# Patient Record
Sex: Female | Born: 1990 | Race: Black or African American | Hispanic: No | Marital: Single | Smoking: Never smoker
Health system: Southern US, Community
[De-identification: ages and names within clinical notes are randomized; demographics above are authoritative.]

## PROBLEM LIST (undated history)

## (undated) DIAGNOSIS — F988 Other specified behavioral and emotional disorders with onset usually occurring in childhood and adolescence: Secondary | ICD-10-CM

## (undated) DIAGNOSIS — I319 Disease of pericardium, unspecified: Secondary | ICD-10-CM

## (undated) DIAGNOSIS — R569 Unspecified convulsions: Secondary | ICD-10-CM

## (undated) DIAGNOSIS — K589 Irritable bowel syndrome without diarrhea: Secondary | ICD-10-CM

## (undated) DIAGNOSIS — G43909 Migraine, unspecified, not intractable, without status migrainosus: Secondary | ICD-10-CM

## (undated) HISTORY — DX: Unspecified convulsions: R56.9

## (undated) HISTORY — DX: Migraine, unspecified, not intractable, without status migrainosus: G43.909

## (undated) HISTORY — DX: Other specified behavioral and emotional disorders with onset usually occurring in childhood and adolescence: F98.8

## (undated) HISTORY — DX: Irritable bowel syndrome without diarrhea: K58.9

---
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
## 2010-05-22 ENCOUNTER — Other Ambulatory Visit (HOSPITAL_COMMUNITY): Payer: Self-pay | Admitting: Orthopedic Surgery

## 2010-05-22 DIAGNOSIS — M25511 Pain in right shoulder: Secondary | ICD-10-CM

## 2010-06-02 ENCOUNTER — Encounter (HOSPITAL_COMMUNITY)
Admission: RE | Admit: 2010-06-02 | Discharge: 2010-06-02 | Disposition: A | Discharge status: Home / Self Care | Payer: BC Managed Care – PPO | Source: Ambulatory Visit | Attending: Orthopedic Surgery | Admitting: Orthopedic Surgery

## 2010-06-02 DIAGNOSIS — M25511 Pain in right shoulder: Secondary | ICD-10-CM

## 2010-06-02 DIAGNOSIS — M25519 Pain in unspecified shoulder: Secondary | ICD-10-CM | POA: Insufficient documentation

## 2010-06-02 MED ORDER — TECHNETIUM TC 99M MEDRONATE IV KIT
25.0000 | PACK | Freq: Once | INTRAVENOUS | Status: AC | PRN
  Administered 2010-06-02: 25 via INTRAVENOUS

## 2010-06-10 HISTORY — PX: SHOULDER SURGERY: SHX246

## 2010-07-01 ENCOUNTER — Other Ambulatory Visit: Payer: Self-pay | Admitting: Orthopedic Surgery

## 2010-07-01 ENCOUNTER — Ambulatory Visit
Admission: RE | Admit: 2010-07-01 | Discharge: 2010-07-01 | Disposition: A | Discharge status: Home / Self Care | Payer: BC Managed Care – PPO | Source: Ambulatory Visit | Attending: Orthopedic Surgery | Admitting: Orthopedic Surgery

## 2010-07-01 DIAGNOSIS — M24019 Loose body in unspecified shoulder: Secondary | ICD-10-CM

## 2010-08-14 ENCOUNTER — Other Ambulatory Visit: Payer: Self-pay | Admitting: Orthopedic Surgery

## 2010-08-14 DIAGNOSIS — M25511 Pain in right shoulder: Secondary | ICD-10-CM

## 2010-08-22 ENCOUNTER — Other Ambulatory Visit: Payer: PRIVATE HEALTH INSURANCE

## 2010-08-22 ENCOUNTER — Inpatient Hospital Stay: Admission: RE | Admit: 2010-08-22 | Payer: PRIVATE HEALTH INSURANCE | Source: Ambulatory Visit

## 2010-08-24 ENCOUNTER — Emergency Department (HOSPITAL_COMMUNITY)
Admission: EM | Admit: 2010-08-24 | Discharge: 2010-08-24 | Disposition: A | Discharge status: Home / Self Care | Payer: BC Managed Care – PPO | Attending: Emergency Medicine | Admitting: Emergency Medicine

## 2010-08-24 ENCOUNTER — Emergency Department (HOSPITAL_COMMUNITY): Payer: BC Managed Care – PPO

## 2010-08-24 ENCOUNTER — Emergency Department (HOSPITAL_COMMUNITY)
Admission: EM | Admit: 2010-08-24 | Discharge: 2010-08-24 | Discharge status: Against Medical Advice | Payer: PRIVATE HEALTH INSURANCE | Attending: Emergency Medicine | Admitting: Emergency Medicine

## 2010-08-24 DIAGNOSIS — R296 Repeated falls: Secondary | ICD-10-CM | POA: Insufficient documentation

## 2010-08-24 DIAGNOSIS — Y92009 Unspecified place in unspecified non-institutional (private) residence as the place of occurrence of the external cause: Secondary | ICD-10-CM | POA: Insufficient documentation

## 2010-08-24 DIAGNOSIS — IMO0002 Reserved for concepts with insufficient information to code with codable children: Secondary | ICD-10-CM | POA: Insufficient documentation

## 2010-08-24 DIAGNOSIS — M79609 Pain in unspecified limb: Secondary | ICD-10-CM | POA: Insufficient documentation

## 2010-08-30 ENCOUNTER — Emergency Department (HOSPITAL_COMMUNITY): Payer: BC Managed Care – PPO

## 2010-08-30 ENCOUNTER — Emergency Department (HOSPITAL_COMMUNITY)
Admission: EM | Admit: 2010-08-30 | Discharge: 2010-08-30 | Disposition: A | Discharge status: Home / Self Care | Payer: BC Managed Care – PPO | Attending: Emergency Medicine | Admitting: Emergency Medicine

## 2010-08-30 DIAGNOSIS — X500XXA Overexertion from strenuous movement or load, initial encounter: Secondary | ICD-10-CM | POA: Insufficient documentation

## 2010-08-30 DIAGNOSIS — M7989 Other specified soft tissue disorders: Secondary | ICD-10-CM | POA: Insufficient documentation

## 2010-08-30 DIAGNOSIS — F411 Generalized anxiety disorder: Secondary | ICD-10-CM | POA: Insufficient documentation

## 2010-08-30 DIAGNOSIS — M25579 Pain in unspecified ankle and joints of unspecified foot: Secondary | ICD-10-CM | POA: Insufficient documentation

## 2010-08-30 DIAGNOSIS — S93409A Sprain of unspecified ligament of unspecified ankle, initial encounter: Secondary | ICD-10-CM | POA: Insufficient documentation

## 2010-11-09 ENCOUNTER — Inpatient Hospital Stay (INDEPENDENT_AMBULATORY_CARE_PROVIDER_SITE_OTHER)
Admission: RE | Admit: 2010-11-09 | Discharge: 2010-11-09 | Disposition: A | Discharge status: Home / Self Care | Payer: BC Managed Care – PPO | Source: Ambulatory Visit | Attending: Emergency Medicine | Admitting: Emergency Medicine

## 2010-11-09 DIAGNOSIS — R112 Nausea with vomiting, unspecified: Secondary | ICD-10-CM

## 2010-11-09 LAB — CBC
HCT: 39.2 % (ref 36.0–46.0)
Hemoglobin: 13.3 g/dL (ref 12.0–15.0)
MCH: 31.1 pg (ref 26.0–34.0)
MCV: 91.8 fL (ref 78.0–100.0)
RBC: 4.27 MIL/uL (ref 3.87–5.11)

## 2010-11-09 LAB — POCT URINALYSIS DIP (DEVICE)
Bilirubin Urine: NEGATIVE
Hgb urine dipstick: NEGATIVE
Ketones, ur: 15 mg/dL — AB
Specific Gravity, Urine: 1.02 (ref 1.005–1.030)
pH: 7 (ref 5.0–8.0)

## 2010-11-09 LAB — DIFFERENTIAL
Lymphocytes Relative: 29 % (ref 12–46)
Lymphs Abs: 2.1 10*3/uL (ref 0.7–4.0)
Monocytes Relative: 8 % (ref 3–12)
Neutro Abs: 4.4 10*3/uL (ref 1.7–7.7)
Neutrophils Relative %: 61 % (ref 43–77)

## 2010-11-09 LAB — COMPREHENSIVE METABOLIC PANEL
ALT: 13 U/L (ref 0–35)
CO2: 26 mEq/L (ref 19–32)
Calcium: 9.8 mg/dL (ref 8.4–10.5)
Creatinine, Ser: 0.56 mg/dL (ref 0.50–1.10)
GFR calc Af Amer: >60 mL/min (ref >60)
GFR calc non Af Amer: >60 mL/min (ref >60)
Glucose, Bld: 89 mg/dL (ref 70–99)
Sodium: 139 mEq/L (ref 135–145)

## 2010-11-09 LAB — POCT I-STAT, CHEM 8
BUN: 11 mg/dL (ref 6–23)
Chloride: 105 mEq/L (ref 96–112)
Sodium: 140 mEq/L (ref 135–145)

## 2010-11-10 HISTORY — PX: CHOLECYSTECTOMY: SHX55

## 2010-11-12 ENCOUNTER — Emergency Department (HOSPITAL_COMMUNITY): Payer: BC Managed Care – PPO

## 2010-11-12 ENCOUNTER — Emergency Department (HOSPITAL_COMMUNITY)
Admission: EM | Admit: 2010-11-12 | Discharge: 2010-11-13 | Disposition: A | Discharge status: Home / Self Care | Payer: BC Managed Care – PPO | Attending: Emergency Medicine | Admitting: Emergency Medicine

## 2010-11-12 DIAGNOSIS — R197 Diarrhea, unspecified: Secondary | ICD-10-CM | POA: Insufficient documentation

## 2010-11-12 DIAGNOSIS — R1013 Epigastric pain: Secondary | ICD-10-CM | POA: Insufficient documentation

## 2010-11-12 DIAGNOSIS — R748 Abnormal levels of other serum enzymes: Secondary | ICD-10-CM | POA: Insufficient documentation

## 2010-11-12 DIAGNOSIS — R112 Nausea with vomiting, unspecified: Secondary | ICD-10-CM | POA: Insufficient documentation

## 2010-11-12 LAB — COMPREHENSIVE METABOLIC PANEL
CO2: 28 mEq/L (ref 19–32)
Calcium: 9.9 mg/dL (ref 8.4–10.5)
Creatinine, Ser: 0.65 mg/dL (ref 0.50–1.10)
GFR calc Af Amer: >90 mL/min (ref >90)
GFR calc non Af Amer: >90 mL/min (ref >90)
Glucose, Bld: 90 mg/dL (ref 70–99)
Total Bilirubin: 0.9 mg/dL (ref 0.3–1.2)

## 2010-11-12 LAB — URINALYSIS, ROUTINE W REFLEX MICROSCOPIC
Glucose, UA: NEGATIVE mg/dL
Hgb urine dipstick: NEGATIVE
Ketones, ur: NEGATIVE mg/dL
Leukocytes, UA: NEGATIVE
Protein, ur: NEGATIVE mg/dL

## 2010-11-12 LAB — DIFFERENTIAL
Lymphocytes Relative: 28 % (ref 12–46)
Lymphs Abs: 2.4 10*3/uL (ref 0.7–4.0)
Monocytes Relative: 10 % (ref 3–12)
Neutro Abs: 5.4 10*3/uL (ref 1.7–7.7)
Neutrophils Relative %: 61 % (ref 43–77)

## 2010-11-12 LAB — CBC
HCT: 40.6 % (ref 36.0–46.0)
Hemoglobin: 13.9 g/dL (ref 12.0–15.0)
MCH: 31.3 pg (ref 26.0–34.0)
MCV: 91.4 fL (ref 78.0–100.0)
RBC: 4.44 MIL/uL (ref 3.87–5.11)

## 2010-11-14 ENCOUNTER — Inpatient Hospital Stay (HOSPITAL_COMMUNITY)
Admission: AD | Admit: 2010-11-14 | Discharge: 2010-11-22 | DRG: 171 | Disposition: A | Discharge status: Home / Self Care | Payer: BC Managed Care – PPO | Source: Ambulatory Visit | Attending: Gastroenterology | Admitting: Gastroenterology

## 2010-11-14 ENCOUNTER — Inpatient Hospital Stay (HOSPITAL_COMMUNITY): Payer: BC Managed Care – PPO

## 2010-11-14 DIAGNOSIS — E860 Dehydration: Secondary | ICD-10-CM | POA: Diagnosis present

## 2010-11-14 DIAGNOSIS — R1013 Epigastric pain: Principal | ICD-10-CM | POA: Diagnosis present

## 2010-11-14 DIAGNOSIS — K811 Chronic cholecystitis: Secondary | ICD-10-CM | POA: Diagnosis present

## 2010-11-14 DIAGNOSIS — Z79899 Other long term (current) drug therapy: Secondary | ICD-10-CM

## 2010-11-14 DIAGNOSIS — R7989 Other specified abnormal findings of blood chemistry: Secondary | ICD-10-CM | POA: Diagnosis present

## 2010-11-14 DIAGNOSIS — M25519 Pain in unspecified shoulder: Secondary | ICD-10-CM | POA: Diagnosis present

## 2010-11-14 LAB — DIFFERENTIAL
Basophils Absolute: 0 10*3/uL (ref 0.0–0.1)
Basophils Relative: 1 % (ref 0–1)
Eosinophils Absolute: 0.2 10*3/uL (ref 0.0–0.7)
Monocytes Absolute: 0.5 10*3/uL (ref 0.1–1.0)
Neutro Abs: 3.6 10*3/uL (ref 1.7–7.7)
Neutrophils Relative %: 54 % (ref 43–77)

## 2010-11-14 LAB — COMPREHENSIVE METABOLIC PANEL
ALT: 72 U/L — ABNORMAL HIGH (ref 0–35)
AST: 204 U/L — ABNORMAL HIGH (ref 0–37)
Alkaline Phosphatase: 75 U/L (ref 39–117)
Calcium: 10.1 mg/dL (ref 8.4–10.5)
GFR calc Af Amer: >90 mL/min (ref >90)
Glucose, Bld: 81 mg/dL (ref 70–99)
Potassium: 4 mEq/L (ref 3.5–5.1)
Sodium: 139 mEq/L (ref 135–145)
Total Protein: 7 g/dL (ref 6.0–8.3)

## 2010-11-14 LAB — CBC
Hemoglobin: 14.2 g/dL (ref 12.0–15.0)
MCHC: 34.5 g/dL (ref 30.0–36.0)
Platelets: 209 10*3/uL (ref 150–400)
RBC: 4.52 MIL/uL (ref 3.87–5.11)

## 2010-11-14 LAB — PROTIME-INR
INR: 1 (ref 0.00–1.49)
Prothrombin Time: 13.4 seconds (ref 11.6–15.2)

## 2010-11-15 ENCOUNTER — Inpatient Hospital Stay (HOSPITAL_COMMUNITY): Payer: BC Managed Care – PPO

## 2010-11-15 DIAGNOSIS — K8020 Calculus of gallbladder without cholecystitis without obstruction: Secondary | ICD-10-CM

## 2010-11-15 DIAGNOSIS — R112 Nausea with vomiting, unspecified: Secondary | ICD-10-CM

## 2010-11-15 DIAGNOSIS — R1013 Epigastric pain: Secondary | ICD-10-CM

## 2010-11-15 LAB — COMPREHENSIVE METABOLIC PANEL
ALT: 50 U/L — ABNORMAL HIGH (ref 0–35)
AST: 94 U/L — ABNORMAL HIGH (ref 0–37)
Albumin: 3.3 g/dL — ABNORMAL LOW (ref 3.5–5.2)
Alkaline Phosphatase: 63 U/L (ref 39–117)
CO2: 27 mEq/L (ref 19–32)
Chloride: 106 mEq/L (ref 96–112)
GFR calc non Af Amer: >90 mL/min (ref >90)
Potassium: 4.2 mEq/L (ref 3.5–5.1)
Sodium: 140 mEq/L (ref 135–145)
Total Bilirubin: 0.8 mg/dL (ref 0.3–1.2)

## 2010-11-15 LAB — SURGICAL PCR SCREEN: MRSA, PCR: NEGATIVE

## 2010-11-15 MED ORDER — GADOBENATE DIMEGLUMINE 529 MG/ML IV SOLN
11.0000 mL | Freq: Once | INTRAVENOUS | Status: AC
  Administered 2010-11-15: 11 mL via INTRAVENOUS

## 2010-11-16 ENCOUNTER — Inpatient Hospital Stay (HOSPITAL_COMMUNITY): Payer: BC Managed Care – PPO

## 2010-11-16 ENCOUNTER — Other Ambulatory Visit (INDEPENDENT_AMBULATORY_CARE_PROVIDER_SITE_OTHER): Payer: Self-pay | Admitting: General Surgery

## 2010-11-16 DIAGNOSIS — K811 Chronic cholecystitis: Secondary | ICD-10-CM

## 2010-11-16 LAB — CBC
MCH: 30.7 pg (ref 26.0–34.0)
MCV: 92 fL (ref 78.0–100.0)
Platelets: 187 10*3/uL (ref 150–400)
RDW: 12.9 % (ref 11.5–15.5)

## 2010-11-16 LAB — COMPREHENSIVE METABOLIC PANEL
AST: 56 U/L — ABNORMAL HIGH (ref 0–37)
Albumin: 2.9 g/dL — ABNORMAL LOW (ref 3.5–5.2)
Calcium: 8.7 mg/dL (ref 8.4–10.5)
Creatinine, Ser: 0.58 mg/dL (ref 0.50–1.10)
Total Protein: 5.1 g/dL — ABNORMAL LOW (ref 6.0–8.3)

## 2010-11-17 ENCOUNTER — Inpatient Hospital Stay (HOSPITAL_COMMUNITY): Payer: BC Managed Care – PPO

## 2010-11-17 DIAGNOSIS — M25519 Pain in unspecified shoulder: Secondary | ICD-10-CM

## 2010-11-17 LAB — COMPREHENSIVE METABOLIC PANEL
ALT: 123 U/L — ABNORMAL HIGH (ref 0–35)
BUN: <3 mg/dL — ABNORMAL LOW (ref 6–23)
CO2: 29 mEq/L (ref 19–32)
Calcium: 8.8 mg/dL (ref 8.4–10.5)
GFR calc Af Amer: >90 mL/min (ref >90)
GFR calc non Af Amer: >90 mL/min (ref >90)
Glucose, Bld: 108 mg/dL — ABNORMAL HIGH (ref 70–99)
Sodium: 141 mEq/L (ref 135–145)

## 2010-11-17 LAB — CBC
HCT: 34.1 % — ABNORMAL LOW (ref 36.0–46.0)
Hemoglobin: 11.5 g/dL — ABNORMAL LOW (ref 12.0–15.0)
MCH: 31.2 pg (ref 26.0–34.0)
MCV: 92.4 fL (ref 78.0–100.0)
RBC: 3.69 MIL/uL — ABNORMAL LOW (ref 3.87–5.11)

## 2010-11-19 ENCOUNTER — Inpatient Hospital Stay (HOSPITAL_COMMUNITY): Payer: BC Managed Care – PPO

## 2010-11-19 LAB — COMPREHENSIVE METABOLIC PANEL
ALT: 259 U/L — ABNORMAL HIGH (ref 0–35)
Albumin: 3 g/dL — ABNORMAL LOW (ref 3.5–5.2)
Alkaline Phosphatase: 144 U/L — ABNORMAL HIGH (ref 39–117)
Glucose, Bld: 89 mg/dL (ref 70–99)
Potassium: 3.9 mEq/L (ref 3.5–5.1)
Sodium: 141 mEq/L (ref 135–145)
Total Protein: 5.6 g/dL — ABNORMAL LOW (ref 6.0–8.3)

## 2010-11-19 LAB — CBC
Hemoglobin: 12.1 g/dL (ref 12.0–15.0)
MCHC: 33.3 g/dL (ref 30.0–36.0)
RDW: 12.9 % (ref 11.5–15.5)

## 2010-11-19 MED ORDER — TECHNETIUM TC 99M MEBROFENIN IV KIT
5.0000 | PACK | Freq: Once | INTRAVENOUS | Status: AC | PRN
  Administered 2010-11-19: 5 via INTRAVENOUS

## 2010-11-20 ENCOUNTER — Inpatient Hospital Stay (HOSPITAL_COMMUNITY): Payer: BC Managed Care – PPO

## 2010-11-20 LAB — COMPREHENSIVE METABOLIC PANEL
ALT: 254 U/L — ABNORMAL HIGH (ref 0–35)
Alkaline Phosphatase: 152 U/L — ABNORMAL HIGH (ref 39–117)
CO2: 28 mEq/L (ref 19–32)
Chloride: 104 mEq/L (ref 96–112)
Glucose, Bld: 90 mg/dL (ref 70–99)
Potassium: 4.1 mEq/L (ref 3.5–5.1)
Sodium: 139 mEq/L (ref 135–145)

## 2010-11-21 LAB — MITOCHONDRIAL ANTIBODIES: Mitochondrial M2 Ab, IgG: 0.01 (ref <0.91)

## 2010-11-24 NOTE — H&P (Signed)
[name], [name]              ACCOUNT NO.:  [id number]  MEDICAL RECORD NO.:  [id number]  LOCATION:  5528                         FACILITY:  [facility]  PHYSICIAN:  Shirley Friar, MDDATE OF BIRTH:  07/07/1990  DATE OF ADMISSION:  11/14/2010 DATE OF DISCHARGE:                             HISTORY & PHYSICAL   ADMISSION DIAGNOSES: 1. Dehydration. 2. Elevated liver enzymes. 3. Abdominal pain. 4. Nausea and vomiting.  HISTORY OF PRESENT ILLNESS:  [name] is a 20 year old female who is a sophomore at [employer] who came into the office accompanied by her mother and was referred by the emergency room due to abdominal pain, nausea, vomiting, elevated liver tests.  Starting this past Saturday, she had acute onset of severe epigastric abdominal pain and profuse nausea and vomiting along with blood in her vomitus after several vomiting episodes.  The abdominal pain has persisted throughout the week and it is a constant sharp and burning sensation in her epigastric, right upper quadrant, and left upper quadrant areas with "random stabbing" sensations in her upper abdomen.  She has had continued profuse nausea and vomiting and unable to take any p.o.'s except for very small amount of liquids on Saturday.  She has been to the Urgent Care or emergency room three times in the past week for her symptoms, and on her most recent episode, her liver tests were elevated  and that they had been normal on November 09, 2010.  On November 12, 2010, her AST was up to 304, ALT was 65 with normal lipase, total bilirubin, ALP, white blood count, and hemoglobin.  Ultrasound showed mild gallbladder sludge without stones.  She was sent home on oxycodone and Zofran.  She states that the oxycodone is not controlling her pain, and she has been unable to function and has been tearful, and unable to go to class due to this severe abdominal pain.  She denies any NSAIDs except on rare occasion.  She  denies any alcohol.  She has had low-grade temperatures 99-100 degrees without any known chills.  She denies any further hematemesis since the weekend.  Denies any similar pain prior to this past Saturday.  CURRENT MEDICATIONS: 1. Oxycodone 10 mg 1/2 tablet p.o. q.6 h. as needed. 2. Zofran 4 mg p.o. b.i.d. as needed. 3. Prevacid 15 mg p.o. daily.  ALLERGIES:  BENADRYL.  PAST MEDICAL HISTORY:  History of migraines, history of vertigo, history of asthma, otherwise unremarkable.  PAST SURGERIES:  Status post right shoulder surgery in 2012, status post left knee surgery in 2008.  FAMILY HISTORY:  Noncontributory from GI standpoint.  SOCIAL HISTORY:  A Archivist at [employer].  Denies alcohol, smoking, or drugs.  REVIEW OF SYSTEMS:  Negative from GI standpoint except stated above.  PHYSICAL EXAMINATION:  VITAL SIGNS: Temperature 98.3, pulse 72, blood pressure 104/60. GENERAL:  Ill appearing, lethargic, pleasant, mild acute distress. HEENT:  Nonicteric sclerae. CHEST:  Clear to auscultation bilaterally. CARDIOVASCULAR:  Regular rate and rhythm without murmurs. ABDOMEN:  Epigastric and right upper quadrant tenderness with guarding, otherwise nontender, soft, nondistended, positive bowel sounds. EXTREMITIES:  No edema.  ASSESSMENT:  This is a 20 year old female with 6 days of severe  right upper quadrant and epigastric abdominal pain with profuse nausea, vomiting, and elevated liver enzymes which is concerning for biliary colic and less likely dyspepsia or peptic ulcer disease.  She appears dehydrated and needs admission for intravenous fluids, pain control, antiemetics, and will need an inpatient surgical consult to see about gallbladder removal.  She is failing outpatient treatment with oxycodone and oral intake and needs to be admitted for further evaluation.     Shirley Friar, MD     VCS/MEDQ  D:  11/14/2010  T:  11/15/2010  Job:   [id number]  Electronically Signed by Charlott Rakes MD on 11/24/2010 07:55:31 PM

## 2010-11-25 NOTE — Op Note (Signed)
[name], [name]              ACCOUNT NO.:  [id number]  MEDICAL RECORD NO.:  [id number]  LOCATION:  5528                         FACILITY:  [facility]  PHYSICIAN:  Cherylynn Ridges, M.D.    DATE OF BIRTH:  09-19-1990  DATE OF PROCEDURE:  11/16/2010 DATE OF DISCHARGE:                              OPERATIVE REPORT   PREOPERATIVE DIAGNOSES:  Symptomatic cholelithiasis and biliary colic.  POSTOPERATIVE DIAGNOSES:  Symptomatic cholelithiasis and biliary colic.  PROCEDURE:  Laparoscopic cholecystectomy.  SURGEON:  Cherylynn Ridges, MD  ANESTHESIA:  General endotracheal.  ESTIMATED BLOOD LOSS:  Less than 20 mL.  COMPLICATIONS:  None.  CONDITION:  Stable.  FINDINGS:  Mildly inflamed, edematous gallbladder with normal intraoperative cholangiogram.  INDICATIONS FOR OPERATION:  The patient is a 20 year old admitted with symptoms of acute cholecystitis, who now is having a gallbladder removed because of symptomatic gallstone disease.  OPERATION:  The patient was taken to the operating room, placed on table in supine position.  After an adequate general endotracheal anesthetic was administered, she was prepped and draped in usual sterile manner exposing the entire abdomen.  After proper time-out was performed identifying the patient and the procedure to be performed, an infraumbilical midline incision was made using a #15 blade and taken down to the midline fascia.  We incised the fascia with 15 blade and grabbed the edges with Kocher clamps.  We then bluntly dissected down into and through the preperitoneal space into the peritoneal cavity.  Once we entered peritoneal cavity, a pursestring suture of 0 Vicryl was passed around the fascial opening.  We then passed a Hasson cannula into the peritoneal cavity.  The pursestring suture was secured in the Hasson cannula, then we subsequently insufflated carbon dioxide gas up to a maximal pressure of 50 mmHg.  Once this was done, the  patient was placed in reverse Trendelenburg, the left side was tilted down.  Two right upper quadrant 5-mm cannulas and a subxiphoid 5-mm cannula were passed under direct vision into the peritoneal cavity.  Once that was done, the dissection was begun.  We grabbed the gallbladder and retracted it towards the anterior abdominal wall and right upper quadrant.  A second grasper was used to retract the infundibulum.  We dissected out the peritoneum overlying the triangle of Calot and hepatic duodenal triangle.  We isolated the cystic duct and the cystic artery.  We doubly clipped the cystic artery proximally and then singly distally and transected it.  The cystic duct was clipped along the gallbladder side.  A cholecystoductotomy made using laparoscopic scissors, then a cholangiogram performed using a Cook catheter which had been passed through the anterior abdominal wall.  The cholangiogram showed a good flow into the duodenum, no intraductal filling defects, no dilatation and good proximal filling.  Once the cholangiogram was completed, the clip holding the duct and the catheter in place was removed.  We removed the catheter, clipped the distal cystic duct x2, then transected it.  We then dissected out the gallbladder from the bed with minimal blood loss, minimal difficulty. We used an EndoCatch bag to remove it from the infraumbilical site.  We inspected the bed for bleeding and there  was minimal bleeding.  We irrigated it with small amount of saline and aspirated all fluid and gas from above the liver and then closed.  The infraumbilical fascial site was closed using a pursestring suture, was sutured in place.  Marcaine 0.25% with epi was injected at all sites.  All needle counts, sponge counts, and instrument counts were correct.  The only site closed with suture at the skin was the infraumbilical skin site which was closed using a running subcuticular stitch of 4-0 Monocryl.   Dermabond, Steri-Strips, and Tegaderm were used to complete all dressings.  All needle counts, sponge counts, and instrument counts were correct.     Cherylynn Ridges, M.D.     JOW/MEDQ  D:  11/16/2010  T:  11/16/2010  Job:  [id number]  Electronically Signed by Jimmye Norman M.D. on 11/25/2010 07:31:08 AM

## 2010-11-26 ENCOUNTER — Telehealth (INDEPENDENT_AMBULATORY_CARE_PROVIDER_SITE_OTHER): Payer: Self-pay | Admitting: General Surgery

## 2010-12-03 NOTE — Consult Note (Signed)
NAMEJENNIFERMARIE, Andrade              ACCOUNT NO.:  [id number]  MEDICAL RECORD NO.:  [id number]  LOCATION:  5528                         FACILITY:  [facility]  PHYSICIAN:  Almond Lint, MD       DATE OF BIRTH:  1990/02/27  DATE OF CONSULTATION:  11/15/2010 DATE OF DISCHARGE:                                CONSULTATION   TIME OF CONSULTATION:  11 a.m.  REQUESTING PHYSICIAN:  Petra Kuba, MD  REASON FOR CONSULTATION:  Abdominal pain.  HISTORY OF PRESENT ILLNESS:  [name] is a 20 year old female who has a history of exercise-induced asthma who began having epigastric abdominal pain last Saturday.  She states that occasionally this radiates to the left and right upper quadrant, but mostly epigastric. She does have nausea and vomiting specifically after eating.  She has had several ER visits this past week with elevated AST and ALT.  She had ultrasound at that time that revealed no gallstones, but debris and sludge in the gallbladder.  No evidence of cholecystitis.  She was sent home and informed to follow up with the gastroenterologist.  She saw Dr. Bosie Clos yesterday with continued pain.  She was then admitted and an MRCP has been ordered.  This has been completed, but not read.  Her AST and ALT were also elevated yesterday; however, repeat labs have not been done yet today; however, her AST and ALT yesterday upon arrival were 204 and 72.  We have been asked to evaluate the patient for possible biliary colic for possible lab chole.  REVIEW OF SYSTEMS:  Please see HPI.  Otherwise all other systems are reviewed and are negative.  FAMILY HISTORY:  Noncontributory.  PAST MEDICAL HISTORY:  Exercise-induced asthma.  PAST SURGICAL HISTORY:  Shoulder and knee surgery.  SOCIAL HISTORY:  The patient is a Medical laboratory scientific officer at [employer].  She denies any alcohol, tobacco, or illicit drug abuse.  She does play soft ball.  ALLERGIES:  BENADRYL.  MEDICATIONS AT HOME: 1. Oxycodone 10 mg  half tablet every 6 hours. 2. Zofran 4 mg 1 tablet b.i.d. 3. Prevacid 15 mg daily.  PHYSICAL EXAMINATION:  GENERAL:  [name] is a pleasant 20 year old female who is currently lying in bed tired from pain medicine, but otherwise in no acute distress. VITAL SIGNS:  Temperature 98.2, pulse 51, blood pressure 109/68, respirations 18. HEENT:  Head is normocephalic and atraumatic.  Sclerae noninjected. Pupils are equal, round, and reactive to light.  Ears and nose without any obvious masses or lesions.  No rhinorrhea.  Mouth is pink.  Throat shows no exudate. HEART:  Regular rate and rhythm.  Normal S1 and S2.  No murmurs, gallops, or rubs are noted. LUNGS:  Clear to auscultation bilaterally.  No wheezes, rhonchi, or rales noted.  Respirations are nonlabored. ABDOMEN:  Soft.  Tender in the epigastric area along with some mild right upper quadrant tenderness, but no Murphy sign.  She does have positive bowel sounds and is otherwise nondistended.  No masses, hernias, or organomegaly are noted. MUSCULOSKELETAL:  All four extremities are symmetrical.  No cyanosis, clubbing, or edema. SKIN:  Warm and dry.  No masses, lesions, or rashes. PSYCHIATRIC:  The patient  alert and oriented with an appropriate affect.  LABS AND DIAGNOSTICS:  All are pending for today.  However, white blood cell count is 26,600, total bilirubin 0.6, alk phos 75, AST 204, ALT 72. Ultrasound reveals no stones, but sludge.  No gallbladder wall thickening.  No common duct dilatation.  Her MRCP is currently pending.  IMPRESSION: 1. Biliary colic. 2. Asthma.  PLAN:  We will await the patient's MRCP results.  We will obtain a stat CMET.  If the patient's MRCP does not show any common duct stone then we will go ahead and proceed hopefully today with laparoscopic cholecystectomy.  If for some reason the patient does have evidence of a common bile duct stones then we will allow GI to proceed with an ERCP prior to  surgical intervention.  I have discussed this with the patient and her mom who is present in the room.     Letha Cape, PA   ______________________________ Almond Lint, MD    KEO/MEDQ  D:  11/15/2010  T:  11/15/2010  Job:  [id number]  cc:   Petra Kuba, M.D. Shirley Friar, MD  Electronically Signed by Barnetta Chapel PA on 11/27/2010 05:38:53 PM Electronically Signed by Almond Lint MD on 12/03/2010 07:19:08 AM

## 2010-12-11 NOTE — Discharge Summary (Signed)
  [name], [name]              ACCOUNT NO.:  [id number]  MEDICAL RECORD NO.:  [id number]  LOCATION:  5528                         FACILITY:  [facility]  PHYSICIAN:  Willis Modena, MD     DATE OF BIRTH:  05-Jul-1990  DATE OF ADMISSION:  11/14/2010 DATE OF DISCHARGE:  11/22/2010                              DISCHARGE SUMMARY   ADMISSION DIAGNOSIS:  Abdominal pain, elevated LFTs.  DISCHARGE DIAGNOSIS:  Abdominal pain, elevated LFTs.  CONSULTATIONS:  General Surgery.  HISTORY OF PRESENT ILLNESS:  [name] is a 20 year old female presenting with progressive abdominal pain with elevated LFTs.  It was felt that her symptoms were potentially gallbladder in nature.  She was admitted for pain control and evaluation for cholecystectomy.  HOSPITAL COURSE:  Upon admission, surgical consultation was obtained, and she ultimately underwent cholecystectomy after MRCP, which showed no obvious common bile duct stones.  Postcholecystectomy, she continued to have the same type of pain that she had preoperatively.  HIDA scan post surgery showed no bile leak.  Intraoperative cholangiogram showed no bile duct stones.  Repeat MRCP again showed no bile duct stones.  She had laboratory evaluation for elevated LFTs, which included an acute hepatitis panel, antinuclear antibody, antimitochondrial antibody, anti- smooth muscle antibody, all of which were negative.  Her liver tests have essentially stayed the same since her hospital course.  She continues to have burning abdominal pain, but is able to tolerate a regular diet, and does not require IV pain medicine.  The nature of her abdominal pain is not clear to me.  She has shown no evidence of fevers or other obvious abnormalities on multiple different imaging studies both pre and postoperatively.  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  It was felt at this time that she could be evaluated further as an  outpatient and an inpatient stay was not required.  She was agreeable with this.  DISCHARGE MEDICATIONS: 1. Protonix 40 mg twice a day. 2. Percocet 5/325 one to two tablets every 6 hours as needed with 35     tablets dispensed. 3. Zofran 4 mg 1 tablet by mouth every 6 hours as needed, dispensed 45     tablets.  DISCHARGE FOLLOWUP:  She will follow up with Dr. Randa Evens or Dr. Bosie Clos who have been following here in the hospital in the next couple weeks.  DISCHARGE DIET:  Regular as tolerated.  BRIEF DISCHARGE INSTRUCTIONS:  She was instructed to seek medical care for any worsening abdominal pain, nausea, vomiting, or any other worrisome symptoms.  If her liver tests do not start down trending, she may need a liver biopsy, which again can also be coordinated as an outpatient.     Willis Modena, MD     WO/MEDQ  D:  11/22/2010  T:  11/22/2010  Job:  [id number]  Electronically Signed by Willis Modena  on 12/11/2010 03:24:16 PM

## 2010-12-16 ENCOUNTER — Ambulatory Visit (INDEPENDENT_AMBULATORY_CARE_PROVIDER_SITE_OTHER): Payer: BC Managed Care – PPO | Admitting: General Surgery

## 2010-12-16 ENCOUNTER — Encounter (INDEPENDENT_AMBULATORY_CARE_PROVIDER_SITE_OTHER): Payer: Self-pay | Admitting: General Surgery

## 2010-12-16 VITALS — BP 110/78 | HR 68 | Temp 99.0°F | Resp 16 | Ht 63.0 in | Wt 123.0 lb

## 2010-12-16 DIAGNOSIS — Z09 Encounter for follow-up examination after completed treatment for conditions other than malignant neoplasm: Secondary | ICD-10-CM | POA: Insufficient documentation

## 2010-12-16 NOTE — Progress Notes (Signed)
HPI The patient is still having problems with nausea vomiting and weight loss status post laparoscopic cholecystectomy. However the problems and not related to the surgery. They're more related to some overall process that has not been uncovered yet  PE On exam her wounds have healed well with no infection. She has normal active bowel sounds.  Studiy review I've lived at an MRCP and a HIDA scan done postoperatively which are within normal limits.  Assessment Recovering well from her surgical procedure but continued to have gastroenterology problems.  Plan She will continue to have followup through the gastroenterologist. She is to followup to see me on a p.r.n. basis

## 2011-11-10 HISTORY — PX: CARDIAC CATHETERIZATION: SHX172

## 2011-11-30 ENCOUNTER — Emergency Department (HOSPITAL_COMMUNITY): Payer: BC Managed Care – PPO

## 2011-11-30 ENCOUNTER — Emergency Department (HOSPITAL_COMMUNITY)
Admission: EM | Admit: 2011-11-30 | Discharge: 2011-11-30 | Disposition: A | Discharge status: Home / Self Care | Payer: BC Managed Care – PPO | Attending: Emergency Medicine | Admitting: Emergency Medicine

## 2011-11-30 ENCOUNTER — Encounter (HOSPITAL_COMMUNITY): Payer: Self-pay | Admitting: Emergency Medicine

## 2011-11-30 DIAGNOSIS — R0989 Other specified symptoms and signs involving the circulatory and respiratory systems: Secondary | ICD-10-CM | POA: Insufficient documentation

## 2011-11-30 DIAGNOSIS — R079 Chest pain, unspecified: Secondary | ICD-10-CM | POA: Insufficient documentation

## 2011-11-30 DIAGNOSIS — R0609 Other forms of dyspnea: Secondary | ICD-10-CM | POA: Insufficient documentation

## 2011-11-30 DIAGNOSIS — J45909 Unspecified asthma, uncomplicated: Secondary | ICD-10-CM | POA: Insufficient documentation

## 2011-11-30 DIAGNOSIS — R0602 Shortness of breath: Secondary | ICD-10-CM | POA: Insufficient documentation

## 2011-11-30 LAB — D-DIMER, QUANTITATIVE: D-Dimer, Quant: <0.27 ug/mL-FEU (ref 0.00–0.48)

## 2011-11-30 MED ORDER — IBUPROFEN 600 MG PO TABS: 600.0000 mg | ORAL_TABLET | Freq: Four times a day (QID) | ORAL | Status: DC | PRN

## 2011-11-30 MED ORDER — IBUPROFEN 800 MG PO TABS
ORAL_TABLET | ORAL | Status: AC
  Administered 2011-11-30: 800 mg
  Filled 2011-11-30: qty 1

## 2011-11-30 MED ORDER — OXYCODONE-ACETAMINOPHEN 5-325 MG PO TABS
2.0000 | ORAL_TABLET | Freq: Once | ORAL | Status: AC
  Administered 2011-11-30: 2 via ORAL
  Filled 2011-11-30: qty 2

## 2011-11-30 NOTE — ED Notes (Signed)
Pt. Is not able to use the restroom at this time, but is aware that we need a urine specimen. 

## 2011-11-30 NOTE — ED Provider Notes (Signed)
Medical screening examination/treatment/procedure(s) were conducted as a shared visit with non-physician practitioner(s) and myself.  I personally evaluated the patient during the encounter  Doug Sou, MD 11/30/11 2157

## 2011-11-30 NOTE — ED Notes (Signed)
Pt presenting to ed with c/o waking up this morning with chest pain. Pt denies shortness of breath, nausea and vomiting at this time.

## 2011-11-30 NOTE — ED Notes (Signed)
Pt's mother  At bedside is concerned that pt's cp is d/t pt's liver.  Pt reports that about a year ago, she was transported to [facility] d/t elevated liver enzymes.  She was wondering if her liver enzymes are elevated again.  [place] EDPA notified and went in to speak to pt and parents.

## 2011-11-30 NOTE — ED Provider Notes (Signed)
History     CSN: [id number]  Arrival date & time 11/30/11  4098   First MD Initiated Contact with Patient 11/30/11 [phone]      Chief Complaint  Patient presents with  . Chest Pain    (Consider location/radiation/quality/duration/timing/severity/associated sxs/prior treatment) HPI  21 year old female with history of asthma, well controlled,  presents complaining of chest pain. Pt reports she has been having midsternal chest pain since this AM.  Pain is sharp, shooting, moderate in severity, with mild pleuritic component.  Did endorse mild SOB this am and dypsnea on exertion when walking to her car today.  Pain has been persistent and worsening.  Pain is non radiating.  No associated fever, cough, hemoptysis, n/v, diaphoresis,  abd pain, or rash.  No aggravating or alleviating factors.  Has not tried anything to alleviate her sxs.  No hx of premature cardiac death.  No exertional syncope.  Non smoker, no hx of diabetes.  Not taking birth control pill, recent travel, prolonged bed rest, leg swelling or prior hx of DVT/PE.    Past Medical History  Diagnosis Date  . Asthma     Past Surgical History  Procedure Date  . Cholecystectomy 11/2010  . Shoulder surgery 06/2010    right  . Knee arthroscopy 11/2006    left    Family History  Problem Relation Age of Onset  . Heart disease Maternal Grandfather     heart attack  . Cancer Paternal Grandfather     prostate    History  Substance Use Topics  . Smoking status: Never Smoker   . Smokeless tobacco: Not on file  . Alcohol Use: No    OB History    Grav Para Term Preterm Abortions TAB SAB Ect Mult Living                  Review of Systems  All other systems reviewed and are negative.    Allergies  Benadryl  Home Medications   Current Outpatient Rx  Name Route Sig Dispense Refill  . NATAZIA 3/2-2/2-3/1 MG PO TABS  daily.    Marland Kitchen ONDANSETRON HCL 4 MG PO TABS  Ad lib.    . OXYCODONE HCL 5 MG PO TABS  Ad lib.    Marland Kitchen  PROMETHAZINE HCL 25 MG PO TABS  Ad lib.      BP 139/95  Pulse 68  Temp 98.7 F (37.1 C) (Oral)  Resp 16  SpO2 100%  LMP 10/31/2011  Physical Exam  Nursing note and vitals reviewed. Constitutional: She appears well-developed and well-nourished. No distress.       Awake, alert, nontoxic appearance  HENT:  Head: Atraumatic.  Eyes: Conjunctivae normal are normal. Right eye exhibits no discharge. Left eye exhibits no discharge.  Neck: Neck supple.  Cardiovascular: Normal rate and regular rhythm.   Pulmonary/Chest: Effort normal. No respiratory distress. She exhibits no tenderness.  Abdominal: Soft. There is no tenderness. There is no rebound.  Musculoskeletal: She exhibits no edema and no tenderness.       ROM appears intact, no obvious focal weakness  Neurological:       Mental status and motor strength appears intact  Skin: No rash noted.  Psychiatric: She has a normal mood and affect.    ED Course  Procedures (including critical care time)  Results for orders placed during the hospital encounter of 11/30/11  D-DIMER, QUANTITATIVE      Component Value Range   D-Dimer, Quant <0.27  0.00 - 0.48  ug/mL-FEU  POCT PREGNANCY, URINE      Component Value Range   Preg Test, Ur NEGATIVE  NEGATIVE   Dg Chest 2 View  11/30/2011  *RADIOLOGY REPORT*  Clinical Data: Mid chest pain  CHEST - 2 VIEW  Comparison: None  Findings: Upper-normal size of cardiac silhouette. Mediastinal contours and pulmonary vascularity normal. Lungs clear. No pleural effusion or pneumothorax. No focal bony abnormalities.  IMPRESSION: Normal exam.   Original Report Authenticated By: Lollie Marrow, M.D.       Date: 11/30/2011  Rate: 72  Rhythm: normal sinus rhythm  QRS Axis: normal  Intervals: normal  ST/T Wave abnormalities: normal  Conduction Disutrbances: none  Narrative Interpretation:   Old EKG Reviewed: none available   1. Chest pain  MDM  midsternal cp, with report of mild SOB and DOE.  Pain not  reproducible on exam.  No significant risk factors for CAD.  Since pt does endorse DOE, will obtain CXR, d-dimer, preg test.  WIll give pain medication.  My attending has seen and evaluate pt.    10:59 AM Patient reports mild improvement with pain medication. She is currently afebrile and her vital signs stable. EKG is unremarkable, chest x-ray is normal, she has a negative d-dimer, and her pregnancy test is negative. Chlamydia patient will further benefit from outpatient follow up for her chest pain complaint. I will give resources.  Will also recommend NSAIDs for her pain.  Patient and family member voice understanding and agrees with plan.    BP 132/86  Pulse 66  Temp 98.4 F (36.9 C) (Oral)  Resp 18  SpO2 100%  LMP 10/31/2011  I have reviewed nursing notes and vital signs. I personally reviewed the imaging tests through PACS system  I reviewed available ER/hospitalization records thought the EMR       Fayrene Helper, PA-C 11/30/11 1102

## 2011-11-30 NOTE — ED Provider Notes (Signed)
Complains of substernal pleuritic chest pain onset upon awakening apparently 5:30 AM today pain is nonradiating moderate to severe. No cough no shortness of breath no other complaint. On exam alert nontoxic lungs clear auscultation heart regular rate and rhythm no murmurs or rubs chest pain is reproduced with forcible abduction of left shoulder all 4 extremities neurovascularly intact  Doug Sou, MD 11/30/11 [phone]

## 2013-11-13 ENCOUNTER — Emergency Department (HOSPITAL_COMMUNITY)
Admission: EM | Admit: 2013-11-13 | Discharge: 2013-11-13 | Disposition: A | Discharge status: Home / Self Care | Payer: Commercial Indemnity | Attending: Emergency Medicine | Admitting: Emergency Medicine

## 2013-11-13 ENCOUNTER — Encounter (HOSPITAL_COMMUNITY): Payer: Self-pay | Admitting: Emergency Medicine

## 2013-11-13 DIAGNOSIS — S70361A Insect bite (nonvenomous), right thigh, initial encounter: Secondary | ICD-10-CM | POA: Diagnosis present

## 2013-11-13 DIAGNOSIS — L02415 Cutaneous abscess of right lower limb: Secondary | ICD-10-CM

## 2013-11-13 DIAGNOSIS — Y939 Activity, unspecified: Secondary | ICD-10-CM | POA: Insufficient documentation

## 2013-11-13 DIAGNOSIS — W57XXXA Bitten or stung by nonvenomous insect and other nonvenomous arthropods, initial encounter: Secondary | ICD-10-CM | POA: Diagnosis not present

## 2013-11-13 DIAGNOSIS — Y929 Unspecified place or not applicable: Secondary | ICD-10-CM | POA: Insufficient documentation

## 2013-11-13 DIAGNOSIS — R112 Nausea with vomiting, unspecified: Secondary | ICD-10-CM | POA: Insufficient documentation

## 2013-11-13 DIAGNOSIS — R61 Generalized hyperhidrosis: Secondary | ICD-10-CM | POA: Insufficient documentation

## 2013-11-13 DIAGNOSIS — Z79899 Other long term (current) drug therapy: Secondary | ICD-10-CM | POA: Insufficient documentation

## 2013-11-13 DIAGNOSIS — J45909 Unspecified asthma, uncomplicated: Secondary | ICD-10-CM | POA: Diagnosis not present

## 2013-11-13 DIAGNOSIS — Z792 Long term (current) use of antibiotics: Secondary | ICD-10-CM | POA: Insufficient documentation

## 2013-11-13 MED ORDER — LIDOCAINE-EPINEPHRINE (PF) 2 %-1:200000 IJ SOLN
10.0000 mL | Freq: Once | INTRAMUSCULAR | Status: AC
  Administered 2013-11-13: 10 mL
  Filled 2013-11-13: qty 20

## 2013-11-13 MED ORDER — OXYCODONE-ACETAMINOPHEN 5-325 MG PO TABS: 2.0000 | ORAL_TABLET | Freq: Four times a day (QID) | ORAL | Status: DC | PRN

## 2013-11-13 MED ORDER — OXYCODONE-ACETAMINOPHEN 5-325 MG PO TABS
2.0000 | ORAL_TABLET | Freq: Once | ORAL | Status: AC
  Administered 2013-11-13: 2 via ORAL
  Filled 2013-11-13: qty 2

## 2013-11-13 NOTE — ED Provider Notes (Signed)
Medical screening examination/treatment/procedure(s) were performed by non-physician practitioner and as supervising physician I was immediately available for consultation/collaboration.   EKG Interpretation None        Belanna Manring F Daiwik Buffalo, MD 11/13/13 1835 

## 2013-11-13 NOTE — ED Provider Notes (Signed)
CSN: [id number]     Arrival date & time 11/13/13  1156 History   First MD Initiated Contact with Patient 11/13/13 1211     Chief Complaint  Patient presents with  . Insect Bite     (Consider location/radiation/quality/duration/timing/severity/associated sxs/prior Treatment) HPI Pt is a 22yo female presenting to ED with c/o gradually worsening insect bite that occurred on Tuesday 9/29, seen at [facility] on Friday, 10/2.  Discharged home with Doxycycline.  Pt reports minimal improvement in erythema but worsening pain and scant purulent discharge with centralized region darkening in color.  Pt states she has used warm compresses to help with the discharge.  Reports subjective fever with chills and cold sweats. Pain is aching 8/10 at worst. States she was given vicodin by urgent care that has helped some with pain.  Pt states she thinks she needs a different antibiotic as this one does not seem to be working, however, pt states she was given a 10 day supply and is only about half-way through the medication. Reports nausea and 1 episode of vomiting today.  No previous hx of abscesses. No other significant PMH.    Past Medical History  Diagnosis Date  . Asthma    Past Surgical History  Procedure Laterality Date  . Cholecystectomy  11/2010  . Shoulder surgery  06/2010    right  . Knee arthroscopy  11/2006    left   Family History  Problem Relation Age of Onset  . Heart disease Maternal Grandfather     heart attack  . Cancer Paternal Grandfather     prostate   History  Substance Use Topics  . Smoking status: Never Smoker   . Smokeless tobacco: Not on file  . Alcohol Use: No   OB History   Grav Para Term Preterm Abortions TAB SAB Ect Mult Living                 Review of Systems  Constitutional: Positive for fever ( subjective), chills and diaphoresis. Negative for appetite change and fatigue.  Respiratory: Negative for cough and shortness of breath.     Cardiovascular: Negative for chest pain and palpitations.  Gastrointestinal: Positive for nausea and vomiting ( x1 today). Negative for abdominal pain.  Skin: Positive for color change and wound.       Right thigh abscess  All other systems reviewed and are negative.     Allergies  Benadryl  Home Medications   Prior to Admission medications   Medication Sig Start Date End Date Taking? Authorizing Provider  albuterol (PROVENTIL HFA;VENTOLIN HFA) 108 (90 BASE) MCG/ACT inhaler Inhale 2 puffs into the lungs every 6 (six) hours as needed for wheezing or shortness of breath.   Yes Historical Provider, MD  amitriptyline (ELAVIL) 10 MG tablet Take 10 mg by mouth daily. 11/03/13  Yes Historical Provider, MD  amphetamine-dextroamphetamine (ADDERALL) 20 MG tablet Take 20 mg by mouth daily. 09/14/13  Yes Historical Provider, MD  Bath Products (THERAPEUTIC BATH OIL) oil Apply 1 application topically at bedtime.   Yes Historical Provider, MD  clonazePAM (KLONOPIN) 0.5 MG tablet Take 0.5 mg by mouth daily as needed. 11/07/13  Yes Historical Provider, MD  doxycycline (VIBRA-TABS) 100 MG tablet Take 100 mg by mouth 2 (two) times daily. For 10 days 11/10/13  Yes Historical Provider, MD  HYDROcodone-acetaminophen (NORCO/VICODIN) 5-325 MG per tablet Take 0.5 tablets by mouth every 4 (four) hours as needed. 11/10/13  Yes Historical Provider, MD  ibuprofen (ADVIL,MOTRIN) 600 MG  tablet Take 1 tablet (600 mg total) by mouth every 6 (six) hours as needed for pain. 11/30/11  Yes Fayrene HelperBowie Tran, PA-C  Norethindrone Acetate-Ethinyl Estrad-FE (LOESTRIN 24 FE) 1-20 MG-MCG(24) tablet Take 1 tablet by mouth daily.   Yes Historical Provider, MD  SUMAtriptan (IMITREX) 100 MG tablet Take 100 mg by mouth every 2 (two) hours as needed for migraine or headache. May repeat in 2 hours if headache persists or recurs.   Yes Historical Provider, MD  oxyCODONE-acetaminophen (PERCOCET/ROXICET) 5-325 MG per tablet Take 2 tablets by mouth every  6 (six) hours as needed for moderate pain or severe pain. 11/13/13   Junius FinnerErin O'Malley, PA-C   BP 102/86  Pulse 50  Temp(Src) 99.2 F (37.3 C) (Oral)  Resp 18  SpO2 100%  LMP 11/06/2013 Physical Exam  Nursing note and vitals reviewed. Constitutional: She is oriented to person, place, and time. She appears well-developed and well-nourished.  HENT:  Head: Normocephalic and atraumatic.  Eyes: EOM are normal.  Neck: Normal range of motion.  Cardiovascular: Normal rate.   Pulmonary/Chest: Effort normal.  Musculoskeletal: Normal range of motion. She exhibits edema and tenderness.  See skin exam  Neurological: She is alert and oriented to person, place, and time.  Skin: Skin is warm and dry.  Right lateral thigh: 4cm area of erythema and tenderness with 2cm centralized area of ecchymosis and induration. No active discharge. Tender to palpation.  Psychiatric: She has a normal mood and affect. Her behavior is normal.    -Right lateral thigh   ED Course  Procedures   INCISION AND DRAINAGE Performed by: Junius Finner'MALLEY, Haeven Nickle A. Consent: Verbal consent obtained. Risks and benefits: risks, benefits and alternatives were discussed Type: abscess  Body area: right lateral thigh  Anesthesia: local infiltration  Incision was made with a scalpel.  Local anesthetic: lidocaine 2% with epinephrine  Anesthetic total: 1 ml  Complexity: complex Blunt dissection to break up loculations  Drainage: purulent, blood  Drainage amount: 3cc  Packing material: none  Patient tolerance: Patient tolerated the procedure well with no immediate complications.   Labs Review Labs Reviewed - No data to display  Imaging Review No results found.   EKG Interpretation None      MDM   Final diagnoses:  Abscess of right thigh    Pt presenting to ED with abscess with surrounding cellulitis on right lateral thigh following a suspected insect bite 1 week ago.  Pt has been on doxycycline since Friday,  10/2.  I&D successfully performed in ED. Discussed pt with Dr. Wilkie AyeHorton, will have pt continue on doxycycline, strongly encouraged pt to complete entire 10 days of medication. Home care instructions provided. Advised to f/u with PCP or urgent care in 2-3 days for wound recheck if not improving. Return precautions provided. Pt and mother verbalized understanding and agreement with tx plan.     Junius Finnerrin O'Malley, PA-C 11/13/13 1540

## 2013-11-13 NOTE — ED Notes (Signed)
Pt reports noticing spider bite to right posterior thigh on 9/29 that is progressively worsening. [facility] started on Doxycycline. Pt states that redness, pain and swelling to area has continued to increase. Pt reports purulent drainage. Pt reports fever/chills. AO x4. VSS.

## 2013-11-13 NOTE — Discharge Instructions (Signed)
Continue to take antibiotics as prescribed and be sure to complete entire prescription even if symptoms appear to have resolved.  You should change bandaged at least twice daily and use arm compresses to help with drainage of abscess. See below for further instructions.   Abscess Care After An abscess (also called a boil or furuncle) is an infected area that contains a collection of pus. Signs and symptoms of an abscess include pain, tenderness, redness, or hardness, or you may feel a moveable soft area under your skin. An abscess can occur anywhere in the body. The infection may spread to surrounding tissues causing cellulitis. A cut (incision) by the surgeon was made over your abscess and the pus was drained out. The boil may be painful for 5 to 7 days. Most people with a boil do not have high fevers. Your abscess, if seen early, may not have localized, and may not have been lanced. If not, another appointment may be required for this if it does not get better on its own or with medications. HOME CARE INSTRUCTIONS   Only take over-the-counter or prescription medicines for pain, discomfort, or fever as directed by your caregiver.  When you bathe, soak and then remove gauze or iodoform packs at least daily or as directed by your caregiver. You may then wash the wound gently with mild soapy water. Repack with gauze or do as your caregiver directs. SEEK IMMEDIATE MEDICAL CARE IF:   You develop increased pain, swelling, redness, drainage, or bleeding in the wound site.  You develop signs of generalized infection including muscle aches, chills, fever, or a general ill feeling.  An oral temperature above 102 F (38.9 C) develops, not controlled by medication. See your caregiver for a recheck if you develop any of the symptoms described above. If medications (antibiotics) were prescribed, take them as directed. Document Released: 08/14/2004 Document Revised: 04/20/2011 Document Reviewed:  04/11/2007 [facility] Patient Information 2015 [employer] [place]. This information is not intended to replace advice given to you by your health care provider. Make sure you discuss any questions you have with your health care provider.

## 2014-05-28 ENCOUNTER — Emergency Department (HOSPITAL_COMMUNITY): Payer: PRIVATE HEALTH INSURANCE

## 2014-05-28 ENCOUNTER — Encounter (HOSPITAL_COMMUNITY): Payer: Self-pay | Admitting: Emergency Medicine

## 2014-05-28 ENCOUNTER — Inpatient Hospital Stay (HOSPITAL_COMMUNITY)
Admission: EM | Admit: 2014-05-28 | Discharge: 2014-05-30 | DRG: 392 | Disposition: A | Discharge status: Home / Self Care | Payer: PRIVATE HEALTH INSURANCE | Attending: Internal Medicine | Admitting: Internal Medicine

## 2014-05-28 DIAGNOSIS — Z9049 Acquired absence of other specified parts of digestive tract: Secondary | ICD-10-CM | POA: Diagnosis present

## 2014-05-28 DIAGNOSIS — F909 Attention-deficit hyperactivity disorder, unspecified type: Secondary | ICD-10-CM | POA: Diagnosis present

## 2014-05-28 DIAGNOSIS — R109 Unspecified abdominal pain: Secondary | ICD-10-CM | POA: Diagnosis not present

## 2014-05-28 DIAGNOSIS — J4520 Mild intermittent asthma, uncomplicated: Secondary | ICD-10-CM

## 2014-05-28 DIAGNOSIS — R112 Nausea with vomiting, unspecified: Secondary | ICD-10-CM | POA: Diagnosis not present

## 2014-05-28 DIAGNOSIS — K529 Noninfective gastroenteritis and colitis, unspecified: Secondary | ICD-10-CM | POA: Diagnosis present

## 2014-05-28 DIAGNOSIS — K56609 Unspecified intestinal obstruction, unspecified as to partial versus complete obstruction: Secondary | ICD-10-CM | POA: Diagnosis present

## 2014-05-28 DIAGNOSIS — R10814 Left lower quadrant abdominal tenderness: Secondary | ICD-10-CM

## 2014-05-28 DIAGNOSIS — R1084 Generalized abdominal pain: Secondary | ICD-10-CM | POA: Diagnosis not present

## 2014-05-28 DIAGNOSIS — F419 Anxiety disorder, unspecified: Secondary | ICD-10-CM | POA: Diagnosis present

## 2014-05-28 DIAGNOSIS — R197 Diarrhea, unspecified: Secondary | ICD-10-CM

## 2014-05-28 DIAGNOSIS — J45909 Unspecified asthma, uncomplicated: Secondary | ICD-10-CM | POA: Diagnosis present

## 2014-05-28 DIAGNOSIS — Z888 Allergy status to other drugs, medicaments and biological substances status: Secondary | ICD-10-CM

## 2014-05-28 DIAGNOSIS — R1032 Left lower quadrant pain: Secondary | ICD-10-CM | POA: Diagnosis not present

## 2014-05-28 DIAGNOSIS — K5660 Unspecified intestinal obstruction: Secondary | ICD-10-CM | POA: Diagnosis present

## 2014-05-28 HISTORY — DX: Disease of pericardium, unspecified: I31.9

## 2014-05-28 LAB — CBC WITH DIFFERENTIAL/PLATELET
Basophils Absolute: 0 10*3/uL (ref 0.0–0.1)
Basophils Relative: 0 % (ref 0–1)
Eosinophils Absolute: 0 10*3/uL (ref 0.0–0.7)
Eosinophils Relative: 0 % (ref 0–5)
HEMATOCRIT: 41.2 % (ref 36.0–46.0)
HEMOGLOBIN: 13.9 g/dL (ref 12.0–15.0)
LYMPHS ABS: 1.1 10*3/uL (ref 0.7–4.0)
LYMPHS PCT: 6 % — AB (ref 12–46)
MCH: 31.3 pg (ref 26.0–34.0)
MCHC: 33.7 g/dL (ref 30.0–36.0)
MCV: 92.8 fL (ref 78.0–100.0)
MONO ABS: 0.6 10*3/uL (ref 0.1–1.0)
MONOS PCT: 3 % (ref 3–12)
Neutro Abs: 16 10*3/uL — ABNORMAL HIGH (ref 1.7–7.7)
Neutrophils Relative %: 91 % — ABNORMAL HIGH (ref 43–77)
Platelets: 226 10*3/uL (ref 150–400)
RBC: 4.44 MIL/uL (ref 3.87–5.11)
RDW: 13.2 % (ref 11.5–15.5)
WBC: 17.7 10*3/uL — AB (ref 4.0–10.5)

## 2014-05-28 LAB — COMPREHENSIVE METABOLIC PANEL
ALT: 22 U/L (ref 0–35)
AST: 27 U/L (ref 0–37)
Albumin: 4.1 g/dL (ref 3.5–5.2)
Alkaline Phosphatase: 71 U/L (ref 39–117)
Anion gap: 12 (ref 5–15)
BUN: 11 mg/dL (ref 6–23)
CALCIUM: 9.4 mg/dL (ref 8.4–10.5)
CO2: 20 mmol/L (ref 19–32)
Chloride: 110 mmol/L (ref 96–112)
Creatinine, Ser: 0.69 mg/dL (ref 0.50–1.10)
GFR calc non Af Amer: >90 mL/min (ref >90)
GLUCOSE: 120 mg/dL — AB (ref 70–99)
Potassium: 3.3 mmol/L — ABNORMAL LOW (ref 3.5–5.1)
Sodium: 142 mmol/L (ref 135–145)
TOTAL PROTEIN: 7.1 g/dL (ref 6.0–8.3)
Total Bilirubin: 0.7 mg/dL (ref 0.3–1.2)

## 2014-05-28 LAB — I-STAT BETA HCG BLOOD, ED (MC, WL, AP ONLY): I-stat hCG, quantitative: <5 m[IU]/mL (ref <5)

## 2014-05-28 LAB — LIPASE, BLOOD: Lipase: 15 U/L (ref 11–59)

## 2014-05-28 MED ORDER — ONDANSETRON HCL 4 MG/2ML IJ SOLN
4.0000 mg | Freq: Four times a day (QID) | INTRAMUSCULAR | Status: DC | PRN
  Administered 2014-05-28 – 2014-05-29 (×3): 4 mg via INTRAVENOUS
  Filled 2014-05-28 (×3): qty 2

## 2014-05-28 MED ORDER — ALBUTEROL SULFATE (2.5 MG/3ML) 0.083% IN NEBU: 2.5000 mg | INHALATION_SOLUTION | Freq: Four times a day (QID) | RESPIRATORY_TRACT | Status: DC | PRN

## 2014-05-28 MED ORDER — PROMETHAZINE HCL 25 MG/ML IJ SOLN
25.0000 mg | Freq: Once | INTRAMUSCULAR | Status: AC
  Administered 2014-05-28: 25 mg via INTRAVENOUS
  Filled 2014-05-28: qty 1

## 2014-05-28 MED ORDER — IOHEXOL 300 MG/ML  SOLN
100.0000 mL | Freq: Once | INTRAMUSCULAR | Status: AC | PRN
  Administered 2014-05-28: 100 mL via INTRAVENOUS

## 2014-05-28 MED ORDER — KCL IN DEXTROSE-NACL 20-5-0.9 MEQ/L-%-% IV SOLN
INTRAVENOUS | Status: AC
  Administered 2014-05-28: 22:00:00 via INTRAVENOUS
  Filled 2014-05-28 (×3): qty 1000

## 2014-05-28 MED ORDER — POTASSIUM CHLORIDE CRYS ER 20 MEQ PO TBCR
40.0000 meq | EXTENDED_RELEASE_TABLET | Freq: Once | ORAL | Status: AC
  Administered 2014-05-28: 40 meq via ORAL
  Filled 2014-05-28: qty 2

## 2014-05-28 MED ORDER — ACETAMINOPHEN 325 MG PO TABS
650.0000 mg | ORAL_TABLET | Freq: Four times a day (QID) | ORAL | Status: DC | PRN
  Administered 2014-05-29 (×2): 650 mg via ORAL
  Filled 2014-05-28 (×2): qty 2

## 2014-05-28 MED ORDER — ONDANSETRON HCL 4 MG PO TABS: 4.0000 mg | ORAL_TABLET | Freq: Four times a day (QID) | ORAL | Status: DC | PRN

## 2014-05-28 MED ORDER — SODIUM CHLORIDE 0.9 % IV BOLUS (SEPSIS)
1000.0000 mL | Freq: Once | INTRAVENOUS | Status: AC
  Administered 2014-05-28: 1000 mL via INTRAVENOUS

## 2014-05-28 MED ORDER — MORPHINE SULFATE 4 MG/ML IJ SOLN
4.0000 mg | Freq: Once | INTRAMUSCULAR | Status: AC
  Administered 2014-05-28: 4 mg via INTRAVENOUS
  Filled 2014-05-28: qty 1

## 2014-05-28 MED ORDER — ONDANSETRON HCL 4 MG/2ML IJ SOLN
4.0000 mg | Freq: Once | INTRAMUSCULAR | Status: AC
  Administered 2014-05-28: 4 mg via INTRAVENOUS
  Filled 2014-05-28: qty 2

## 2014-05-28 MED ORDER — ACETAMINOPHEN 650 MG RE SUPP
650.0000 mg | Freq: Four times a day (QID) | RECTAL | Status: DC | PRN
Start: 2014-05-28 — End: 2014-05-30

## 2014-05-28 MED ORDER — ONDANSETRON 4 MG PO TBDP: 4.0000 mg | ORAL_TABLET | Freq: Once | ORAL | Status: DC

## 2014-05-28 MED ORDER — FLUTICASONE PROPIONATE 50 MCG/ACT NA SUSP
2.0000 | Freq: Every day | NASAL | Status: DC | PRN
  Filled 2014-05-28: qty 16

## 2014-05-28 MED ORDER — MORPHINE SULFATE 2 MG/ML IJ SOLN
1.0000 mg | INTRAMUSCULAR | Status: DC | PRN
  Administered 2014-05-28 – 2014-05-30 (×6): 1 mg via INTRAVENOUS
  Filled 2014-05-28 (×6): qty 1

## 2014-05-28 NOTE — ED Provider Notes (Signed)
CSN: [id number]     Arrival date & time 05/28/14  1511 History   First MD Initiated Contact with Patient 05/28/14 1513     Chief Complaint  Patient presents with  . Emesis     (Consider location/radiation/quality/duration/timing/severity/associated sxs/prior Treatment) HPI  [name] is a 24 y.o. female with PMH of asthma, cholecystectomy presenting with 2 day history of nausea after eating pizza and emesis. She developed diffuse sharp and stabbing abdominal pain that is constant. She denies alleviating or aggravating factors. Patient also reports nonbloody diarrhea. Patient denies recent antibiotic use or travel. She is not unable to keep anything down. Surgical history include cholecystectomy. She denies any urinary or pelvic complaints.   Past Medical History  Diagnosis Date  . Asthma     "sports induced"  . Pericarditis 2011    post flu   Past Surgical History  Procedure Laterality Date  . Cholecystectomy  11/2010  . Shoulder surgery  06/2010    right  . Knee arthroscopy  11/2006    left  . Cardiac catheterization  Oct. 2013   Family History  Problem Relation Age of Onset  . Heart disease Maternal Grandfather     heart attack  . Cancer Paternal Grandfather     prostate   History  Substance Use Topics  . Smoking status: Never Smoker   . Smokeless tobacco: Never Used  . Alcohol Use: 1.2 oz/week    2 Cans of beer per week   OB History    No data available     Review of Systems 10 Systems reviewed and are negative for acute change except as noted in the HPI.    Allergies  Benadryl  Home Medications   Prior to Admission medications   Medication Sig Start Date End Date Taking? Authorizing Provider  acetaminophen (TYLENOL) 500 MG tablet Take 1,000 mg by mouth every 6 (six) hours as needed for moderate pain or headache.   Yes Historical Provider, MD  albuterol (PROVENTIL HFA;VENTOLIN HFA) 108 (90 BASE) MCG/ACT inhaler Inhale 2 puffs into the lungs every  6 (six) hours as needed for wheezing or shortness of breath.   Yes Historical Provider, MD  amitriptyline (ELAVIL) 10 MG tablet Take 10 mg by mouth daily. 11/03/13  Yes Historical Provider, MD  amphetamine-dextroamphetamine (ADDERALL) 20 MG tablet Take 20 mg by mouth daily. 09/14/13  Yes Historical Provider, MD  clonazePAM (KLONOPIN) 0.5 MG tablet Take 0.5 mg by mouth daily as needed for anxiety.  11/07/13  Yes Historical Provider, MD  fluticasone (FLONASE) 50 MCG/ACT nasal spray Place 2 sprays into both nostrils daily as needed for allergies or rhinitis.   Yes Historical Provider, MD  ibuprofen (ADVIL,MOTRIN) 600 MG tablet Take 1 tablet (600 mg total) by mouth every 6 (six) hours as needed for pain. 11/30/11  Yes Fayrene HelperBowie Tran, PA-C  Norethindrone Acetate-Ethinyl Estrad-FE (LOESTRIN 24 FE) 1-20 MG-MCG(24) tablet Take 1 tablet by mouth daily.   Yes Historical Provider, MD  SUMAtriptan (IMITREX) 100 MG tablet Take 100 mg by mouth every 2 (two) hours as needed for migraine or headache. May repeat in 2 hours if headache persists or recurs.   Yes Historical Provider, MD  oxyCODONE-acetaminophen (PERCOCET/ROXICET) 5-325 MG per tablet Take 2 tablets by mouth every 6 (six) hours as needed for moderate pain or severe pain. Patient not taking: Reported on 05/28/2014 11/13/13   Junius FinnerErin O'Malley, PA-C   BP 110/67 mmHg  Pulse 57  Temp(Src) 98.3 F (36.8 C) (Oral)  Resp  16  Ht  (1.6 m)  Wt 155 lb (70.308 kg)  BMI 27.46 kg/m2  SpO2 100%  LMP 05/29/2014 Physical Exam  Constitutional: She appears well-developed and well-nourished. No distress.  HENT:  Head: Normocephalic and atraumatic.  Mouth/Throat: Oropharynx is clear and moist.  Eyes: Conjunctivae and EOM are normal. Right eye exhibits no discharge. Left eye exhibits no discharge.  Cardiovascular: Normal rate and regular rhythm.   Pulmonary/Chest: Effort normal and breath sounds normal. No respiratory distress. She has no wheezes.  Abdominal: Soft. Bowel  sounds are normal. She exhibits no distension.  Diffuse abdominal tenderness worse in left lower quadrant without rebound, rigidity, guarding. No CVA tenderness.  Neurological: She is alert. She exhibits normal muscle tone. Coordination normal.  Skin: Skin is warm and dry. She is not diaphoretic.  Nursing note and vitals reviewed.   ED Course  Procedures (including critical care time) Labs Review Labs Reviewed  CBC WITH DIFFERENTIAL/PLATELET - Abnormal; Notable for the following:    WBC 17.7 (*)    Neutrophils Relative % 91 (*)    Neutro Abs 16.0 (*)    Lymphocytes Relative 6 (*)    All other components within normal limits  COMPREHENSIVE METABOLIC PANEL - Abnormal; Notable for the following:    Potassium 3.3 (*)    Glucose, Bld 120 (*)    All other components within normal limits  URINE RAPID DRUG SCREEN (HOSP PERFORMED) - Abnormal; Notable for the following:    Opiates POSITIVE (*)    Tetrahydrocannabinol POSITIVE (*)    All other components within normal limits  URINALYSIS, ROUTINE W REFLEX MICROSCOPIC - Abnormal; Notable for the following:    Specific Gravity, Urine 1.038 (*)    Hgb urine dipstick LARGE (*)    All other components within normal limits  BASIC METABOLIC PANEL - Abnormal; Notable for the following:    Chloride 115 (*)    Glucose, Bld 106 (*)    Calcium 7.8 (*)    Anion gap 4 (*)    All other components within normal limits  CBC - Abnormal; Notable for the following:    WBC 12.1 (*)    RBC 3.60 (*)    Hemoglobin 11.1 (*)    HCT 34.4 (*)    All other components within normal limits  CLOSTRIDIUM DIFFICILE BY PCR  STOOL CULTURE  LIPASE, BLOOD  PREGNANCY, URINE  URINE MICROSCOPIC-ADD ON  I-STAT BETA HCG BLOOD, ED ([facility], [facility], [facility] ONLY)    Imaging Review Dg Abd 1 View  05/29/2014   CLINICAL DATA:  Left lower quadrant abdominal pain, nausea, vomiting and diarrhea.  EXAM: ABDOMEN - 1 VIEW  COMPARISON:  Abdomen and pelvis CT dated 05/28/2014.  FINDINGS: Mildly  dilated loops of jejunum in the left mid to upper abdomen with improvement. Interval gas throughout normal caliber colon. Cholecystectomy clips. Normal appearing bones.  IMPRESSION: Improving partial small bowel obstruction or ileus.   Electronically Signed   By: Beckie Salts M.D.   On: 05/29/2014 09:54   Ct Abdomen Pelvis W Contrast  05/28/2014   CLINICAL DATA:  Left lower quadrant pain and vomiting, intermittent over the last 3 days. Previous cholecystectomy.  EXAM: CT ABDOMEN AND PELVIS WITH CONTRAST  TECHNIQUE: Multidetector CT imaging of the abdomen and pelvis was performed using the standard protocol following bolus administration of intravenous contrast.  CONTRAST:  OMNIPAQUE IOHEXOL 300 MG/ML  SOLN  COMPARISON:  MRI 11/21/2010.  FINDINGS: Lung bases are clear.  No pleural or pericardial  fluid.  The liver appears normal without focal lesions. There has been previous cholecystectomy. The biliary system is slightly prominent, consistent with that. No calcified stones are seen. The spleen is normal. The pancreas is normal. The adrenal glands are normal. There are bilateral renal calculi, the largest measuring 4 mm on the right and 5 mm on the left. Sub cm cyst in the left kidney. No renal mass. No hydronephrosis. No evidence of passing stone.  The aorta and IVC are normal. No retroperitoneal mass or lymphadenopathy.  There is a small amount of free fluid in the pelvis. The bladder appears normal. The uterus appears normal. No ovarian lesion identified.  No evidence of diverticulitis. The appendix is normal. There is a single dilated loop of small intestine in the upper central abdomen anteriorly. I wonder if this could be present with an an internal hernia, possibly trans mesenteric.  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  Small amount of free fluid in the pelvis. This could be  physiologic, related to the small bowel pathology, or related to a ruptured ovarian cyst.  Chronic biliary ductal prominence.   Electronically Signed   By: Paulina Fusi M.D.   On: 05/28/2014 19:13     EKG Interpretation None      MDM   Final diagnoses:  LLQ abdominal tenderness  Small bowel obstruction   Patient presenting with one-day history of nausea, vomiting, diarrhea with diffuse abdominal pain worse in left lower quadrant. Surgical history include cholecystectomy. VSS. Patient with diffuse abdominal tenderness worse in left lower quadrant without any evidence of peritonitis. Patient given fluids and pain medicine with improvement of her symptoms. No active nausea and vomiting. CT of the abdomen with dilated loops of small intestine concerning for close obstruction.: On-call surgeon Dr. Daphine Deutscher who recommended nothing by mouth, IV fluids, pain medicine is a for observation. No active nausea vomiting at this time will defer NG tube. Pt with persistent pain. Surgery will follow. Consult to the hospitalists who agreed to evaluate patient in the emergency room for admission.   Discussed all results and patient verbalizes understanding and agrees with plan.  This is a shared patient. This patient was discussed with the physician, Dr. Criss Alvine who saw and evaluated the patient and agrees with the plan.     Oswaldo Conroy, PA-C 05/30/14 1610  Pricilla Loveless, MD 06/02/14 [phone]

## 2014-05-28 NOTE — ED Notes (Signed)
Patient transported to CT 

## 2014-05-28 NOTE — ED Notes (Signed)
Bed: WA25 Expected date:  Expected time:  Means of arrival:  Comments: EMS  

## 2014-05-28 NOTE — ED Notes (Signed)
PA at bedside.

## 2014-05-28 NOTE — H&P (Signed)
[facility] History and Physical  ADDILEE NEU ZOX:[id number] DOB: Feb 02, 1991 DOA: 05/28/2014  Referring physician: Ms. Ranell Patrick. PA. PCP: PROVIDER NOT IN SYSTEM   Chief Complaint: Abdominal pain.  HPI: [name] is a 24 y.o. female with history of asthma and anxiety disorder and ADHD presents to the ER because of nausea vomiting diarrhea and abdominal pain. Patient's symptoms started yesterday with nausea vomiting and diarrhea multiple episodes. Today patient started developing periumbilical and left lower quadrant pain. Pain was persistent. Has no relation to food or diarrhea. In the ER CT abdomen and pelvis shows closed loop obstruction with possibly secondary to transient mesenteric internal hernia. On-call surgeon Dr. Daphine Deutscher was consulted by ER physician and at this time patient has been admitted for further management. Patient denies any chest pain or shortness of breath. Patient has not taken her medications for ADHD and anxiety over the last 1 week since patient had recently traveled for spring break.   Review of Systems: As presented in the history of presenting illness, rest negative.  Past Medical History  Diagnosis Date  . Asthma     "sports induced"   Past Surgical History  Procedure Laterality Date  . Cholecystectomy  11/2010  . Shoulder surgery  06/2010    right  . Knee arthroscopy  11/2006    left  . Cardiac catheterization  Oct. 2013   Social History:  reports that she has never smoked. She has never used smokeless tobacco. She reports that she does not drink alcohol or use illicit drugs. Where does patient live home. Can patient participate in ADLs? Yes.  Allergies  Allergen Reactions  . Benadryl [Diphenhydramine Hcl] Other (See Comments)    Liquid capsules only. Aggressive behavior     Family History:  Family History  Problem Relation Age of Onset  . Heart disease Maternal Grandfather     heart attack  . Cancer Paternal Grandfather    prostate      Prior to Admission medications   Medication Sig Start Date End Date Taking? Authorizing Provider  acetaminophen (TYLENOL) 500 MG tablet Take 1,000 mg by mouth every 6 (six) hours as needed for moderate pain or headache.   Yes Historical Provider, MD  albuterol (PROVENTIL HFA;VENTOLIN HFA) 108 (90 BASE) MCG/ACT inhaler Inhale 2 puffs into the lungs every 6 (six) hours as needed for wheezing or shortness of breath.   Yes Historical Provider, MD  amitriptyline (ELAVIL) 10 MG tablet Take 10 mg by mouth daily. 11/03/13  Yes Historical Provider, MD  amphetamine-dextroamphetamine (ADDERALL) 20 MG tablet Take 20 mg by mouth daily. 09/14/13  Yes Historical Provider, MD  clonazePAM (KLONOPIN) 0.5 MG tablet Take 0.5 mg by mouth daily as needed for anxiety.  11/07/13  Yes Historical Provider, MD  fluticasone (FLONASE) 50 MCG/ACT nasal spray Place 2 sprays into both nostrils daily as needed for allergies or rhinitis.   Yes Historical Provider, MD  ibuprofen (ADVIL,MOTRIN) 600 MG tablet Take 1 tablet (600 mg total) by mouth every 6 (six) hours as needed for pain. 11/30/11  Yes Fayrene Helper, PA-C  Norethindrone Acetate-Ethinyl Estrad-FE (LOESTRIN 24 FE) 1-20 MG-MCG(24) tablet Take 1 tablet by mouth daily.   Yes Historical Provider, MD  SUMAtriptan (IMITREX) 100 MG tablet Take 100 mg by mouth every 2 (two) hours as needed for migraine or headache. May repeat in 2 hours if headache persists or recurs.   Yes Historical Provider, MD  oxyCODONE-acetaminophen (PERCOCET/ROXICET) 5-325 MG per tablet Take 2 tablets by mouth every  6 (six) hours as needed for moderate pain or severe pain. Patient not taking: Reported on 05/28/2014 11/13/13   Junius Finner, PA-C    Physical Exam: Filed Vitals:   05/28/14 1713 05/28/14 1858 05/28/14 2054 05/28/14 2102  BP: 107/68 110/54  118/67  Pulse: 65 79  56  Temp:    98.7 F (37.1 C)  TempSrc:    Oral  Resp: Height:    (1.6 m)   Weight:   70.308 kg (155  lb)   SpO2: 100% 97%  99%     General:  Moderately built and nourished.  Eyes: Anicteric, pallor.  ENT: No discharge from the ears eyes nose or mouth.  Neck: No mass felt.  Cardiovascular: S1 and S2 heard.  Respiratory: No rhonchi or crepitations.  Abdomen: Soft nontender bowel sounds present.  Skin: No rash.  Musculoskeletal: No edema.  Psychiatric: Appears normal.  Neurologic: Alert awake oriented to time place and person. Moves all extremities.  Labs on Admission:  Basic Metabolic Panel:  Recent Labs Lab 05/28/14 1613  NA 142  K 3.3*  CL 110  CO2 20  GLUCOSE 120*  BUN 11  CREATININE 0.69  CALCIUM 9.4   Liver Function Tests:  Recent Labs Lab 05/28/14 1613  AST 27  ALT 22  ALKPHOS 71  BILITOT 0.7  PROT 7.1  ALBUMIN 4.1    Recent Labs Lab 05/28/14 1613  LIPASE 15   No results for input(s): AMMONIA in the last 168 hours. CBC:  Recent Labs Lab 05/28/14 1613  WBC 17.7*  NEUTROABS 16.0*  HGB 13.9  HCT 41.2  MCV 92.8  PLT 226   Cardiac Enzymes: No results for input(s): CKTOTAL, CKMB, CKMBINDEX, TROPONINI in the last 168 hours.  BNP (last 3 results) No results for input(s): BNP in the last 8760 hours.  ProBNP (last 3 results) No results for input(s): PROBNP in the last 8760 hours.  CBG: No results for input(s): GLUCAP in the last 168 hours.  Radiological Exams on Admission: Ct Abdomen Pelvis W Contrast  05/28/2014   CLINICAL DATA:  Left lower quadrant pain and vomiting, intermittent over the last 3 days. Previous cholecystectomy.  EXAM: CT ABDOMEN AND PELVIS WITH CONTRAST  TECHNIQUE: Multidetector CT imaging of the abdomen and pelvis was performed using the standard protocol following bolus administration of intravenous contrast.  CONTRAST:  OMNIPAQUE IOHEXOL 300 MG/ML  SOLN  COMPARISON:  MRI 11/21/2010.  FINDINGS: Lung bases are clear.  No pleural or pericardial fluid.  The liver appears normal without focal lesions. There has  been previous cholecystectomy. The biliary system is slightly prominent, consistent with that. No calcified stones are seen. The spleen is normal. The pancreas is normal. The adrenal glands are normal. There are bilateral renal calculi, the largest measuring 4 mm on the right and 5 mm on the left. Sub cm cyst in the left kidney. No renal mass. No hydronephrosis. No evidence of passing stone.  The aorta and IVC are normal. No retroperitoneal mass or lymphadenopathy.  There is a small amount of free fluid in the pelvis. The bladder appears normal. The uterus appears normal. No ovarian lesion identified.  No evidence of diverticulitis. The appendix is normal. There is a single dilated loop of small intestine in the upper central abdomen anteriorly. I wonder if this could be present with an an internal hernia, possibly trans mesenteric.  IMPRESSION: Dilated loop of small intestine in the anterior abdomen suggesting a closed  loop obstruction, possibly due 2 trans mesenteric internal hernia.  Small amount of free fluid in the pelvis. This could be physiologic, related to the small bowel pathology, or related to a ruptured ovarian cyst.  Chronic biliary ductal prominence.   Electronically Signed   By: Paulina FusiMark  Shogry M.D.   On: 05/28/2014 19:13     Assessment/Plan Principal Problem:   Abdominal pain Active Problems:   Small bowel obstruction   Asthma   Nausea vomiting and diarrhea   1. Abdominal pain with nausea vomiting and diarrhea - I have discussed with on-call surgeon Dr. Daphine DeutscherMartin. At this time there is concern for possible obstruction. But given patient's nausea vomiting diarrhea that is also concern for infectious causes. Dr. Daphine DeutscherMartin has advised at this time to get stool studies and keep patient nothing by mouth for now. I have placed patient on IV fluids and pain relief medications. Follow stool studies and KUB in a.m. If patient's abdominal pain worsens or daughters persistent nausea vomiting will need NG  tube. 2. History of asthma presently not wheezing continue home inhalers. 3. History of ADHD and anxiety disorder - patient has not taken her medications for last 1 week.   DVT Prophylaxis SCDs. Anticipation of possible procedures.  Code Status: Full code.  Family Communication: Patient's mother at the bedside.  Disposition Plan: Admit to inpatient. Likely stay 2-3 days.    [name],Nicole N. [facility] Pager [phone].  If 7PM-7AM, please contact night-coverage [email or web address] Password [email or web address] 05/28/2014, 9:56 PM

## 2014-05-28 NOTE — ED Notes (Signed)
Pt reports yesterday she was feeling nauseous. Ate pizza last night, and then began to throw up. Has been unable to keep down fluids since then. Also has had some diarrhea. Pt dry heaving on arrival. EMS gave 4mg  zofran en route. Pt reports LLQ pain that started this am.

## 2014-05-29 ENCOUNTER — Inpatient Hospital Stay (HOSPITAL_COMMUNITY): Payer: PRIVATE HEALTH INSURANCE

## 2014-05-29 ENCOUNTER — Encounter (HOSPITAL_COMMUNITY): Payer: Self-pay | Admitting: General Surgery

## 2014-05-29 DIAGNOSIS — R1032 Left lower quadrant pain: Secondary | ICD-10-CM

## 2014-05-29 LAB — CBC
HCT: 34.4 % — ABNORMAL LOW (ref 36.0–46.0)
Hemoglobin: 11.1 g/dL — ABNORMAL LOW (ref 12.0–15.0)
MCH: 30.8 pg (ref 26.0–34.0)
MCHC: 32.3 g/dL (ref 30.0–36.0)
MCV: 95.6 fL (ref 78.0–100.0)
PLATELETS: 198 10*3/uL (ref 150–400)
RBC: 3.6 MIL/uL — AB (ref 3.87–5.11)
RDW: 13.9 % (ref 11.5–15.5)
WBC: 12.1 10*3/uL — ABNORMAL HIGH (ref 4.0–10.5)

## 2014-05-29 LAB — URINALYSIS, ROUTINE W REFLEX MICROSCOPIC
BILIRUBIN URINE: NEGATIVE
Glucose, UA: NEGATIVE mg/dL
KETONES UR: NEGATIVE mg/dL
Leukocytes, UA: NEGATIVE
Nitrite: NEGATIVE
PROTEIN: NEGATIVE mg/dL
Specific Gravity, Urine: 1.038 — ABNORMAL HIGH (ref 1.005–1.030)
Urobilinogen, UA: 0.2 mg/dL (ref 0.0–1.0)
pH: 6.5 (ref 5.0–8.0)

## 2014-05-29 LAB — RAPID URINE DRUG SCREEN, HOSP PERFORMED
Amphetamines: NOT DETECTED
Barbiturates: NOT DETECTED
Benzodiazepines: NOT DETECTED
COCAINE: NOT DETECTED
Opiates: POSITIVE — AB
TETRAHYDROCANNABINOL: POSITIVE — AB

## 2014-05-29 LAB — PREGNANCY, URINE: PREG TEST UR: NEGATIVE

## 2014-05-29 LAB — URINE MICROSCOPIC-ADD ON

## 2014-05-29 LAB — BASIC METABOLIC PANEL
Anion gap: 4 — ABNORMAL LOW (ref 5–15)
BUN: 6 mg/dL (ref 6–23)
CALCIUM: 7.8 mg/dL — AB (ref 8.4–10.5)
CO2: 22 mmol/L (ref 19–32)
CREATININE: 0.53 mg/dL (ref 0.50–1.10)
Chloride: 115 mmol/L — ABNORMAL HIGH (ref 96–112)
GFR calc Af Amer: >90 mL/min (ref >90)
GFR calc non Af Amer: >90 mL/min (ref >90)
Glucose, Bld: 106 mg/dL — ABNORMAL HIGH (ref 70–99)
Potassium: 3.5 mmol/L (ref 3.5–5.1)
Sodium: 141 mmol/L (ref 135–145)

## 2014-05-29 MED ORDER — CLONAZEPAM 0.5 MG PO TABS
0.5000 mg | ORAL_TABLET | Freq: Once | ORAL | Status: AC
  Administered 2014-05-29: 0.5 mg via ORAL
  Filled 2014-05-29: qty 1

## 2014-05-29 NOTE — Progress Notes (Signed)
Patient in room 1539, [name], was very upset, crying and getting very loud in the room, because of some other outside conversation that was mentioned  by her mother.   This led to her running to the bathroom and losing her IV in the process.  With the disturbance and trying to calm the patient down, security [facility],  They were told to keep it down and stop and  disturbing the unit.  [name], RN, 06/08/2014, [phone].

## 2014-05-29 NOTE — Consult Note (Signed)
[name] [birth year]  [id number].   Primary Care MD: Dr. In Candyce Churn Requesting MD: Dr. Gean Birchwood Chief Complaint/Reason for Consult: possible sbo  HPI: This is a 24 yo female who is otherwise healthy, who began having some nausea and vomiting on Sunday.  This continued into yesterday along with diarrhea and LLQ abdominal pain that was severe.  The patient states her temperature was 84 and she was blue.  Her mother apparently called EMS and she was brought to the [place] ED.  She had a CT scan that was relatively unremarkable except for 1 loop of dilated SB in the anterior abdomen.  They questioned a possible bowel obstruction, although she was clinically passing flatus and having diarrhea.  She was transferred to [facility] and the hospitalist admitted her.  We have been asked to evaluate the patient.  Her diarrhea has resolved today and her pain is still present, but much improved compared to yesterday.  ROS: Please see HPI, otherwise all other systems are negative  Family History  Problem Relation Age of Onset  . Heart disease Maternal Grandfather     heart attack  . Cancer Paternal Grandfather     prostate    Past Medical History  Diagnosis Date  . Asthma     "sports induced"  . Pericarditis 2011    post flu    Past Surgical History  Procedure Laterality Date  . Cholecystectomy  11/2010  . Shoulder surgery  06/2010    right  . Knee arthroscopy  11/2006    left  . Cardiac catheterization  Oct. 2013    Social History:  reports that she has never smoked. She has never used smokeless tobacco. She reports that she drinks about 1.2 oz of alcohol per week. She reports that she does not use illicit drugs.  Allergies:  Allergies  Allergen Reactions  . Benadryl [Diphenhydramine Hcl] Other (See Comments)    Liquid capsules only. Aggressive behavior     Medications Prior to Admission  Medication Sig Dispense Refill  . acetaminophen (TYLENOL) 500 MG tablet Take  1,000 mg by mouth every 6 (six) hours as needed for moderate pain or headache.    . albuterol (PROVENTIL HFA;VENTOLIN HFA) 108 (90 BASE) MCG/ACT inhaler Inhale 2 puffs into the lungs every 6 (six) hours as needed for wheezing or shortness of breath.    Marland Kitchen amitriptyline (ELAVIL) 10 MG tablet Take 10 mg by mouth daily.    Marland Kitchen amphetamine-dextroamphetamine (ADDERALL) 20 MG tablet Take 20 mg by mouth daily.    . clonazePAM (KLONOPIN) 0.5 MG tablet Take 0.5 mg by mouth daily as needed for anxiety.     . fluticasone (FLONASE) 50 MCG/ACT nasal spray Place 2 sprays into both nostrils daily as needed for allergies or rhinitis.    Marland Kitchen ibuprofen (ADVIL,MOTRIN) 600 MG tablet Take 1 tablet (600 mg total) by mouth every 6 (six) hours as needed for pain. 30 tablet 0  . Norethindrone Acetate-Ethinyl Estrad-FE (LOESTRIN 24 FE) 1-20 MG-MCG(24) tablet Take 1 tablet by mouth daily.    . SUMAtriptan (IMITREX) 100 MG tablet Take 100 mg by mouth every 2 (two) hours as needed for migraine or headache. May repeat in 2 hours if headache persists or recurs.    Marland Kitchen oxyCODONE-acetaminophen (PERCOCET/ROXICET) 5-325 MG per tablet Take 2 tablets by mouth every 6 (six) hours as needed for moderate pain or severe pain. (Patient not taking: Reported on 05/28/2014) 6 tablet 0    Blood pressure 96/50, pulse  51, temperature 98.2 F (36.8 C), temperature source Oral, resp. rate 16, height 5' 3"  (1.6 m), weight 70.308 kg (155 lb), last menstrual period 05/29/2014, SpO2 100 %. Physical Exam: General: pleasant, WD, WN female who is laying in bed in NAD HEENT: head is normocephalic, atraumatic.  Sclera are noninjected.  PERRL.  Ears and nose without any masses or lesions.  Mouth is pink and moist Heart: regular, rate, and rhythm.  Normal s1,s2. No obvious murmurs, gallops, or rubs noted.  Palpable radial and pedal pulses bilaterally Lungs: CTAB, no wheezes, rhonchi, or rales noted.  Respiratory effort nonlabored Abd: soft, minimally tender in LLQ,  but no guarding or reboudning, ND, +BS, no masses, hernias, or organomegaly MS: all 4 extremities are symmetrical with no cyanosis, clubbing, or edema. Skin: warm and dry with no masses, lesions, or rashes Psych: A&Ox3 with an appropriate affect.    Results for orders placed or performed during the hospital encounter of 05/28/14 (from the past 48 hour(s))  CBC with Differential     Status: Abnormal   Collection Time: 05/28/14  4:13 PM  Result Value Ref Range   WBC 17.7 (H) 4.0 - 10.5 K/uL   RBC 4.44 3.87 - 5.11 MIL/uL   Hemoglobin 13.9 12.0 - 15.0 g/dL   HCT 41.2 36.0 - 46.0 %   MCV 92.8 78.0 - 100.0 fL   MCH 31.3 26.0 - 34.0 pg   MCHC 33.7 30.0 - 36.0 g/dL   RDW 13.2 11.5 - 15.5 %   Platelets 226 150 - 400 K/uL   Neutrophils Relative % 91 (H) 43 - 77 %   Neutro Abs 16.0 (H) 1.7 - 7.7 K/uL   Lymphocytes Relative 6 (L) 12 - 46 %   Lymphs Abs 1.1 0.7 - 4.0 K/uL   Monocytes Relative 3 3 - 12 %   Monocytes Absolute 0.6 0.1 - 1.0 K/uL   Eosinophils Relative 0 0 - 5 %   Eosinophils Absolute 0.0 0.0 - 0.7 K/uL   Basophils Relative 0 0 - 1 %   Basophils Absolute 0.0 0.0 - 0.1 K/uL  Lipase, blood     Status: None   Collection Time: 05/28/14  4:13 PM  Result Value Ref Range   Lipase 15 11 - 59 U/L  Comprehensive metabolic panel     Status: Abnormal   Collection Time: 05/28/14  4:13 PM  Result Value Ref Range   Sodium 142 135 - 145 mmol/L   Potassium 3.3 (L) 3.5 - 5.1 mmol/L   Chloride 110 96 - 112 mmol/L   CO2 20 19 - 32 mmol/L   Glucose, Bld 120 (H) 70 - 99 mg/dL   BUN 11 6 - 23 mg/dL   Creatinine, Ser 0.69 0.50 - 1.10 mg/dL   Calcium 9.4 8.4 - 10.5 mg/dL   Total Protein 7.1 6.0 - 8.3 g/dL   Albumin 4.1 3.5 - 5.2 g/dL   AST 27 0 - 37 U/L   ALT 22 0 - 35 U/L   Alkaline Phosphatase 71 39 - 117 U/L   Total Bilirubin 0.7 0.3 - 1.2 mg/dL   GFR calc non Af Amer >90 >90 mL/min   GFR calc Af Amer >90 >90 mL/min    Comment: (NOTE) The eGFR has been calculated using the CKD EPI  equation. This calculation has not been validated in all clinical situations. eGFR's persistently <90 mL/min signify possible Chronic Kidney Disease.    Anion gap 12 5 - 15  I-Stat Beta hCG blood,  ED ([facility], [facility], [facility] only)     Status: None   Collection Time: 05/28/14  6:09 PM  Result Value Ref Range   I-stat hCG, quantitative <5.0 <5 mIU/mL   Comment 3            Comment:   GEST. AGE      CONC.  (mIU/mL)   <=1 WEEK        5 - 50     2 WEEKS       50 - 500     3 WEEKS       100 - 10,000     4 WEEKS     1,000 - 30,000        FEMALE AND NON-PREGNANT FEMALE:     LESS THAN 5 mIU/mL   Basic metabolic panel     Status: Abnormal   Collection Time: 05/29/14  5:50 AM  Result Value Ref Range   Sodium 141 135 - 145 mmol/L   Potassium 3.5 3.5 - 5.1 mmol/L   Chloride 115 (H) 96 - 112 mmol/L   CO2 22 19 - 32 mmol/L   Glucose, Bld 106 (H) 70 - 99 mg/dL   BUN 6 6 - 23 mg/dL   Creatinine, Ser 0.53 0.50 - 1.10 mg/dL   Calcium 7.8 (L) 8.4 - 10.5 mg/dL   GFR calc non Af Amer >90 >90 mL/min   GFR calc Af Amer >90 >90 mL/min    Comment: (NOTE) The eGFR has been calculated using the CKD EPI equation. This calculation has not been validated in all clinical situations. eGFR's persistently <90 mL/min signify possible Chronic Kidney Disease.    Anion gap 4 (L) 5 - 15  CBC     Status: Abnormal   Collection Time: 05/29/14  5:50 AM  Result Value Ref Range   WBC 12.1 (H) 4.0 - 10.5 K/uL   RBC 3.60 (L) 3.87 - 5.11 MIL/uL   Hemoglobin 11.1 (L) 12.0 - 15.0 g/dL    Comment: REPEATED TO VERIFY DELTA CHECK NOTED    HCT 34.4 (L) 36.0 - 46.0 %   MCV 95.6 78.0 - 100.0 fL   MCH 30.8 26.0 - 34.0 pg   MCHC 32.3 30.0 - 36.0 g/dL   RDW 13.9 11.5 - 15.5 %   Platelets 198 150 - 400 K/uL  Pregnancy, urine     Status: None   Collection Time: 05/29/14  6:25 AM  Result Value Ref Range   Preg Test, Ur NEGATIVE NEGATIVE    Comment:        THE SENSITIVITY OF THIS METHODOLOGY IS >20 mIU/mL.   Urine rapid drug screen  (hosp performed)     Status: Abnormal   Collection Time: 05/29/14  6:25 AM  Result Value Ref Range   Opiates POSITIVE (A) NONE DETECTED   Cocaine NONE DETECTED NONE DETECTED   Benzodiazepines NONE DETECTED NONE DETECTED   Amphetamines NONE DETECTED NONE DETECTED   Tetrahydrocannabinol POSITIVE (A) NONE DETECTED   Barbiturates NONE DETECTED NONE DETECTED    Comment:        DRUG SCREEN FOR MEDICAL PURPOSES ONLY.  IF CONFIRMATION IS NEEDED FOR ANY PURPOSE, NOTIFY LAB WITHIN 5 DAYS.        LOWEST DETECTABLE LIMITS FOR URINE DRUG SCREEN Drug Class       Cutoff (ng/mL) Amphetamine      1000 Barbiturate      200 Benzodiazepine   081 Tricyclics       448 Opiates  300 Cocaine          300 THC              50   Urinalysis, Routine w reflex microscopic     Status: Abnormal   Collection Time: 05/29/14  6:25 AM  Result Value Ref Range   Color, Urine YELLOW YELLOW   APPearance CLEAR CLEAR   Specific Gravity, Urine 1.038 (H) 1.005 - 1.030   pH 6.5 5.0 - 8.0   Glucose, UA NEGATIVE NEGATIVE mg/dL   Hgb urine dipstick LARGE (A) NEGATIVE   Bilirubin Urine NEGATIVE NEGATIVE   Ketones, ur NEGATIVE NEGATIVE mg/dL   Protein, ur NEGATIVE NEGATIVE mg/dL   Urobilinogen, UA 0.2 0.0 - 1.0 mg/dL   Nitrite NEGATIVE NEGATIVE   Leukocytes, UA NEGATIVE NEGATIVE  Urine microscopic-add on     Status: None   Collection Time: 05/29/14  6:25 AM  Result Value Ref Range   Squamous Epithelial / LPF RARE RARE   WBC, UA 0-2 <3 WBC/hpf   RBC / HPF 21-50 <3 RBC/hpf   Dg Abd 1 View  05/29/2014   CLINICAL DATA:  Left lower quadrant abdominal pain, nausea, vomiting and diarrhea.  EXAM: ABDOMEN - 1 VIEW  COMPARISON:  Abdomen and pelvis CT dated 05/28/2014.  FINDINGS: Mildly dilated loops of jejunum in the left mid to upper abdomen with improvement. Interval gas throughout normal caliber colon. Cholecystectomy clips. Normal appearing bones.  IMPRESSION: Improving partial small bowel obstruction or ileus.    Electronically Signed   By: Claudie Revering M.D.   On: 05/29/2014 09:54   Ct Abdomen Pelvis W Contrast  05/28/2014   CLINICAL DATA:  Left lower quadrant pain and vomiting, intermittent over the last 3 days. Previous cholecystectomy.  EXAM: CT ABDOMEN AND PELVIS WITH CONTRAST  TECHNIQUE: Multidetector CT imaging of the abdomen and pelvis was performed using the standard protocol following bolus administration of intravenous contrast.  CONTRAST:  111m OMNIPAQUE IOHEXOL 300 MG/ML  SOLN  COMPARISON:  MRI 11/21/2010.  FINDINGS: Lung bases are clear.  No pleural or pericardial fluid.  The liver appears normal without focal lesions. There has been previous cholecystectomy. The biliary system is slightly prominent, consistent with that. No calcified stones are seen. The spleen is normal. The pancreas is normal. The adrenal glands are normal. There are bilateral renal calculi, the largest measuring 4 mm on the right and 5 mm on the left. Sub cm cyst in the left kidney. No renal mass. No hydronephrosis. No evidence of passing stone.  The aorta and IVC are normal. No retroperitoneal mass or lymphadenopathy.  There is a small amount of free fluid in the pelvis. The bladder appears normal. The uterus appears normal. No ovarian lesion identified.  No evidence of diverticulitis. The appendix is normal. There is a single dilated loop of small intestine in the upper central abdomen anteriorly. I wonder if this could be present with an an internal hernia, possibly trans mesenteric.  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  Small amount of free fluid in the pelvis. This could be physiologic, related to the small bowel pathology, or related to a ruptured ovarian cyst.  Chronic biliary ductal prominence.   Electronically Signed   By: MNelson ChimesM.D.   On: 05/28/2014 19:13       Assessment/Plan 1.  Nausea/vomiting/diarrhea, abdominal pain -suspect this is secondary to a gastroenteritis, possibly infectious in  origin. -we do not see any evidence after review of her CT scan of a close loop bowel obstruction, or a bowel obstruction in general.  This loop is likely reactive to her suspected GE. -I doubt she has C diff, but will defer that to the primary service, if they wish to check this.  She is no longer having diarrhea. -she can have clear liquids and her diet can be advanced as tolerates.   -there are no indications for surgical intervention.  We will defer further care to the primary service.  Please call us back if needed.  Tallula Grindle E 05/29/2014, 10:18 AM Pager: [phone]

## 2014-05-29 NOTE — Progress Notes (Signed)
[facility] PROGRESS NOTE   Nicole PINCUS Andrade:[id number] DOB: [birth year] DOA: 05/28/2014 PCP: PROVIDER NOT IN SYSTEM  HPI/Subjective: Seen with mother and significant other at bedside. No nausea or vomiting since yesterday feels much better. History and imaging not consistent with SBO.  Assessment/Plan: Principal Problem:   Abdominal pain Active Problems:   Small bowel obstruction   Asthma   Nausea vomiting and diarrhea   Nausea, vomiting and diarrhea/enteritis Presented with the above complaints for one day, had significant other was same but milder symptoms. Reported vomiting innumerable times had also left lower quadrant abdominal pain. CT scan did not show any evidence of acute findings except loop filled of gas and fluids. I think this is consistent with gastroenteritis rather than small bowel obstruction, started on clear liquids.  Abdominal pain Complains about LLQ abdominal pain, CT scan showed no acute findings in that area. Urinalysis negative, pregnancy test is negative.  Substance abuse UDS is positive for THC.  Code Status: Full Code Family Communication: Plan discussed with the patient. Disposition Plan: Remains inpatient Diet: Diet clear liquid Room service appropriate?: Yes; Fluid consistency:: Thin  Consultants:  Gen surgery  Procedures:  None  Antibiotics:  None   Objective: Filed Vitals:   05/29/14 1400  BP: 105/62  Pulse: 62  Temp: 98.3 F (36.8 C)  Resp:     Intake/Output Summary (Last 24 hours) at 05/29/14 1440 Last data filed at 05/29/14 1400  Gross per 24 hour  Intake   1560 ml  Output    600 ml  Net    960 ml   Filed Weights   05/28/14 2054  Weight: 70.308 kg (155 lb)    Exam: General: Alert and awake, oriented x3, not in any acute distress. HEENT: anicteric sclera, pupils reactive to light and accommodation, EOMI CVS: S1-S2 clear, no murmur rubs or gallops Chest: clear to auscultation bilaterally, no  wheezing, rales or rhonchi Abdomen: soft nontender, nondistended, normal bowel sounds, no organomegaly Extremities: no cyanosis, clubbing or edema noted bilaterally Neuro: Cranial nerves II-XII intact, no focal neurological deficits  Data Reviewed: Basic Metabolic Panel:  Recent Labs Lab 05/28/14 1613 05/29/14 0550  NA 142 141  K 3.3* 3.5  CL 110 115*  CO2 20 22  GLUCOSE 120* 106*  BUN 11 6  CREATININE 0.69 0.53  CALCIUM 9.4 7.8*   Liver Function Tests:  Recent Labs Lab 05/28/14 1613  AST 27  ALT 22  ALKPHOS 71  BILITOT 0.7  PROT 7.1  ALBUMIN 4.1    Recent Labs Lab 05/28/14 1613  LIPASE 15   No results for input(s): AMMONIA in the last 168 hours. CBC:  Recent Labs Lab 05/28/14 1613 05/29/14 0550  WBC 17.7* 12.1*  NEUTROABS 16.0*  --   HGB 13.9 11.1*  HCT 41.2 34.4*  MCV 92.8 95.6  PLT 226 198   Cardiac Enzymes: No results for input(s): CKTOTAL, CKMB, CKMBINDEX, TROPONINI in the last 168 hours. BNP (last 3 results) No results for input(s): BNP in the last 8760 hours.  ProBNP (last 3 results) No results for input(s): PROBNP in the last 8760 hours.  CBG: No results for input(s): GLUCAP in the last 168 hours.  Micro No results found for this or any previous visit (from the past 240 hour(s)).   Studies: Dg Abd 1 View  05/29/2014   CLINICAL DATA:  Left lower quadrant abdominal pain, nausea, vomiting and diarrhea.  EXAM: ABDOMEN - 1 VIEW  COMPARISON:  Abdomen and pelvis CT dated  05/28/2014.  FINDINGS: Mildly dilated loops of jejunum in the left mid to upper abdomen with improvement. Interval gas throughout normal caliber colon. Cholecystectomy clips. Normal appearing bones.  IMPRESSION: Improving partial small bowel obstruction or ileus.   Electronically Signed   By: Beckie SaltsSteven  Reid M.D.   On: 05/29/2014 09:54   Ct Abdomen Pelvis W Contrast  05/28/2014   CLINICAL DATA:  Left lower quadrant pain and vomiting, intermittent over the last 3 days. Previous  cholecystectomy.  EXAM: CT ABDOMEN AND PELVIS WITH CONTRAST  TECHNIQUE: Multidetector CT imaging of the abdomen and pelvis was performed using the standard protocol following bolus administration of intravenous contrast.  CONTRAST:  100mL OMNIPAQUE IOHEXOL 300 MG/ML  SOLN  COMPARISON:  MRI 11/21/2010.  FINDINGS: Lung bases are clear.  No pleural or pericardial fluid.  The liver appears normal without focal lesions. There has been previous cholecystectomy. The biliary system is slightly prominent, consistent with that. No calcified stones are seen. The spleen is normal. The pancreas is normal. The adrenal glands are normal. There are bilateral renal calculi, the largest measuring 4 mm on the right and 5 mm on the left. Sub cm cyst in the left kidney. No renal mass. No hydronephrosis. No evidence of passing stone.  The aorta and IVC are normal. No retroperitoneal mass or lymphadenopathy.  There is a small amount of free fluid in the pelvis. The bladder appears normal. The uterus appears normal. No ovarian lesion identified.  No evidence of diverticulitis. The appendix is normal. There is a single dilated loop of small intestine in the upper central abdomen anteriorly. I wonder if this could be present with an an internal hernia, possibly trans mesenteric.  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  Small amount of free fluid in the pelvis. This could be physiologic, related to the small bowel pathology, or related to a ruptured ovarian cyst.  Chronic biliary ductal prominence.   Electronically Signed   By: Paulina FusiMark  Shogry M.D.   On: 05/28/2014 19:13    Scheduled Meds:  Continuous Infusions: . dextrose 5 % and 0.9 % NaCl with KCl 20 mEq/L 100 mL/hr at 05/28/14 2224       Time spent: 35 minutes    [facility] CreekELMAHI,Breiana Stratmann A  [facility] Pager [phone] If 7PM-7AM, please contact night-coverage at  [email or web address], password [email or web address] 05/29/2014, 2:40 PM  LOS: 1 day

## 2014-05-30 LAB — CLOSTRIDIUM DIFFICILE BY PCR: CDIFFPCR: NEGATIVE

## 2014-05-30 MED ORDER — OXYCODONE-ACETAMINOPHEN 5-325 MG PO TABS: 1.0000 | ORAL_TABLET | ORAL | Status: DC | PRN

## 2014-05-30 NOTE — Progress Notes (Signed)
05/30/14 1245  Reviewed discharge instructions with patient. Patient verbalized understanding of discharge instructions. Copy of school note, prescription, and discharge instructions given to patient.

## 2014-05-30 NOTE — Discharge Summary (Signed)
Physician Discharge Summary  Nicole FuchsMonica N Andrade EAV:[id number] DOB: Jun 20, 1990 DOA: 05/28/2014  PCP: PROVIDER NOT IN SYSTEM  Admit date: 05/28/2014 Discharge date: 05/30/2014  Time spent: < 30 minutes  Recommendations for Outpatient Follow-up:  1. Follow up with PCP as needed   Discharge Diagnoses:  Principal Problem:   Abdominal pain Active Problems:   Small bowel obstruction   Asthma   Nausea vomiting and diarrhea  Discharge Condition: stable  Diet recommendation: regular  Filed Weights   05/28/14 2054  Weight: 70.308 kg (155 lb)   History of present illness:  Nicole FuchsMonica N Andrade is a 24 y.o. female with history of asthma and anxiety disorder and ADHD presents to the ER because of nausea vomiting diarrhea and abdominal pain. Patient's symptoms started yesterday with nausea vomiting and diarrhea multiple episodes. Today patient started developing periumbilical and left lower quadrant pain. Pain was persistent. Has no relation to food or diarrhea. In the ER CT abdomen and pelvis shows closed loop obstruction with possibly secondary to transient mesenteric internal hernia. On-call surgeon Dr. Daphine DeutscherMartin was consulted by ER physician and at this time patient has been admitted for further management. Patient denies any chest pain or shortness of breath. Patient has not taken her medications for ADHD and anxiety over the last 1 week since patient had recently traveled for spring break.   Hospital Course:  Nausea, vomiting and diarrhea/enteritis Presented with the above complaints for one day, had significant other was same but milder symptoms. Reported vomiting innumerable times had also left lower quadrant abdominal pain. CT scan did not show any evidence of acute findings except loop filled of gas and fluids. Her presentation was consistent with gastroenteritis rather than small bowel obstruction; general surgery was consulted and have evaluated patient as well Her symptoms have improved  and patient was able to tolerate a regular diet and with good consistent po intake. Her leukocytosis resolved with conservative management.  She was discharged home in stable condition C diff was negative Abdominal pain Complains about LLQ abdominal pain, CT scan showed no acute findings in that area. Urinalysis negative, pregnancy test is negative. Substance abuse UDS is positive for THC.  Procedures:  None    Consultations:  General surgery   Discharge Exam: Filed Vitals:   05/29/14 0600 05/29/14 1400 05/29/14 2200 05/30/14 0635  BP: 96/50 105/62 110/67 115/65  Pulse: 51 62 57 52  Temp: 98.2 F (36.8 C) 98.3 F (36.8 C) 98.3 F (36.8 C) 98.8 F (37.1 C)  TempSrc: Oral Oral Oral Oral  Resp: 16  16 16   Height:      Weight:      SpO2: 100%  100% 100%    General: NAD Cardiovascular: RRR Respiratory: CTA biL  Discharge Instructions     Medication List    TAKE these medications        acetaminophen 500 MG tablet  Commonly known as:  TYLENOL  Take 1,000 mg by mouth every 6 (six) hours as needed for moderate pain or headache.     albuterol 108 (90 BASE) MCG/ACT inhaler  Commonly known as:  PROVENTIL HFA;VENTOLIN HFA  Inhale 2 puffs into the lungs every 6 (six) hours as needed for wheezing or shortness of breath.     amitriptyline 10 MG tablet  Commonly known as:  ELAVIL  Take 10 mg by mouth daily.     amphetamine-dextroamphetamine 20 MG tablet  Commonly known as:  ADDERALL  Take 20 mg by mouth daily.  clonazePAM 0.5 MG tablet  Commonly known as:  KLONOPIN  Take 0.5 mg by mouth daily as needed for anxiety.     fluticasone 50 MCG/ACT nasal spray  Commonly known as:  FLONASE  Place 2 sprays into both nostrils daily as needed for allergies or rhinitis.     ibuprofen 600 MG tablet  Commonly known as:  ADVIL,MOTRIN  Take 1 tablet (600 mg total) by mouth every 6 (six) hours as needed for pain.     LOESTRIN 24 FE 1-20 MG-MCG(24) tablet  Generic drug:   Norethindrone Acetate-Ethinyl Estrad-FE  Take 1 tablet by mouth daily.     oxyCODONE-acetaminophen 5-325 MG per tablet  Commonly known as:  PERCOCET/ROXICET  Take 1-2 tablets by mouth every 4 (four) hours as needed for severe pain.     SUMAtriptan 100 MG tablet  Commonly known as:  IMITREX  Take 100 mg by mouth every 2 (two) hours as needed for migraine or headache. May repeat in 2 hours if headache persists or recurs.           Follow-up Information    Follow up with PCP.   Why:  If symptoms worsen      The results of significant diagnostics from this hospitalization (including imaging, microbiology, ancillary and laboratory) are listed below for reference.    Significant Diagnostic Studies: Dg Abd 1 View  05/29/2014   CLINICAL DATA:  Left lower quadrant abdominal pain, nausea, vomiting and diarrhea.  EXAM: ABDOMEN - 1 VIEW  COMPARISON:  Abdomen and pelvis CT dated 05/28/2014.  FINDINGS: Mildly dilated loops of jejunum in the left mid to upper abdomen with improvement. Interval gas throughout normal caliber colon. Cholecystectomy clips. Normal appearing bones.  IMPRESSION: Improving partial small bowel obstruction or ileus.   Electronically Signed   By: Beckie Salts M.D.   On: 05/29/2014 09:54   Ct Abdomen Pelvis W Contrast  05/28/2014   CLINICAL DATA:  Left lower quadrant pain and vomiting, intermittent over the last 3 days. Previous cholecystectomy.  EXAM: CT ABDOMEN AND PELVIS WITH CONTRAST  TECHNIQUE: Multidetector CT imaging of the abdomen and pelvis was performed using the standard protocol following bolus administration of intravenous contrast.  CONTRAST:  OMNIPAQUE IOHEXOL 300 MG/ML  SOLN  COMPARISON:  MRI 11/21/2010.  FINDINGS: Lung bases are clear.  No pleural or pericardial fluid.  The liver appears normal without focal lesions. There has been previous cholecystectomy. The biliary system is slightly prominent, consistent with that. No calcified stones are seen. The  spleen is normal. The pancreas is normal. The adrenal glands are normal. There are bilateral renal calculi, the largest measuring 4 mm on the right and 5 mm on the left. Sub cm cyst in the left kidney. No renal mass. No hydronephrosis. No evidence of passing stone.  The aorta and IVC are normal. No retroperitoneal mass or lymphadenopathy.  There is a small amount of free fluid in the pelvis. The bladder appears normal. The uterus appears normal. No ovarian lesion identified.  No evidence of diverticulitis. The appendix is normal. There is a single dilated loop of small intestine in the upper central abdomen anteriorly. I wonder if this could be present with an an internal hernia, possibly trans mesenteric.  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  Small amount of free fluid in the pelvis. This could be physiologic, related to the small bowel pathology, or related  to a ruptured ovarian cyst.  Chronic biliary ductal prominence.   Electronically Signed   By: Paulina Fusi M.D.   On: 05/28/2014 19:13    Microbiology: Recent Results (from the past 240 hour(s))  Clostridium Difficile by PCR     Status: None   Collection Time: 05/30/14  9:46 AM  Result Value Ref Range Status   C difficile by pcr NEGATIVE NEGATIVE Final     Labs: Basic Metabolic Panel:  Recent Labs Lab 05/28/14 1613 05/29/14 0550  NA 142 141  K 3.3* 3.5  CL 110 115*  CO2 20 22  GLUCOSE 120* 106*  BUN 11 6  CREATININE 0.69 0.53  CALCIUM 9.4 7.8*   Liver Function Tests:  Recent Labs Lab 05/28/14 1613  AST 27  ALT 22  ALKPHOS 71  BILITOT 0.7  PROT 7.1  ALBUMIN 4.1    Recent Labs Lab 05/28/14 1613  LIPASE 15   CBC:  Recent Labs Lab 05/28/14 1613 05/29/14 0550  WBC 17.7* 12.1*  NEUTROABS 16.0*  --   HGB 13.9 11.1*  HCT 41.2 34.4*  MCV 92.8 95.6  PLT 226 198    Signed:  GHERGHE,  COSTIN  [facility] 05/30/2014, 4:03 PM

## 2014-05-30 NOTE — Discharge Instructions (Signed)
You were cared for by a hospitalist during your hospital stay. If you have any questions about your discharge medications or the care you received while you were in the hospital after you are discharged, you can call the unit and asked to speak with the hospitalist on call if the hospitalist that took care of you is not available. Once you are discharged, your primary care physician will handle any further medical issues. Please note that NO REFILLS for any discharge medications will be authorized once you are discharged, as it is imperative that you return to your primary care physician (or establish a relationship with a primary care physician if you do not have one) for your aftercare needs so that they can reassess your need for medications and monitor your lab values.  You can reach the hospitalist office at phone [phone] or fax [phone]   If you do not have a primary care physician, you can call [phone] for a physician referral.  Follow with Primary MD as needed  Get CBC, CMP checked by your doctor and again as further instructed.  Get a 2 view Chest X ray done next visit if you had Pneumonia of Lung problems at the Hospital.  Get Medicines reviewed and adjusted.  Please request your Prim.MD to go over all Hospital Tests and Procedure/Radiological results at the follow up, please get all Hospital records sent to your Prim MD by signing hospital release before you go home.  Activity: As tolerated with Full fall precautions use walker/cane & assistance as needed  Diet: regular  For Heart failure patients - Check your Weight same time everyday, if you gain over 2 pounds, or you develop in leg swelling, experience more shortness of breath or chest pain, call your Primary MD immediately. Follow Cardiac Low Salt Diet and 1.8 lit/day fluid restriction.  Disposition Home  If you experience worsening of your admission symptoms, develop shortness of breath, life threatening emergency,  suicidal or homicidal thoughts you must seek medical attention immediately by calling 911 or calling your MD immediately  if symptoms less severe.  You Must read complete instructions/literature along with all the possible adverse reactions/side effects for all the Medicines you take and that have been prescribed to you. Take any new Medicines after you have completely understood and accpet all the possible adverse reactions/side effects.   Do not drive and provide baby sitting services if your were admitted for syncope or siezures until you have seen by Primary MD or a Neurologist and advised to do so again.  Do not drive when taking Pain medications.   Do not take more than prescribed Pain, Sleep and Anxiety Medications  Special Instructions: If you have smoked or chewed Tobacco  in the last 2 yrs please stop smoking, stop any regular Alcohol  and or any Recreational drug use.  Wear Seat belts while driving.

## 2014-06-03 LAB — STOOL CULTURE

## 2014-11-16 ENCOUNTER — Encounter (HOSPITAL_COMMUNITY)
Admission: RE | Admit: 2014-11-16 | Discharge: 2014-11-16 | Disposition: A | Discharge status: Home / Self Care | Payer: PRIVATE HEALTH INSURANCE | Source: Ambulatory Visit | Attending: Obstetrics & Gynecology | Admitting: Obstetrics & Gynecology

## 2014-11-16 ENCOUNTER — Encounter (HOSPITAL_COMMUNITY): Payer: Self-pay

## 2014-11-16 ENCOUNTER — Other Ambulatory Visit: Payer: Self-pay

## 2014-11-16 DIAGNOSIS — G8929 Other chronic pain: Secondary | ICD-10-CM | POA: Diagnosis not present

## 2014-11-16 DIAGNOSIS — R102 Pelvic and perineal pain: Secondary | ICD-10-CM | POA: Insufficient documentation

## 2014-11-16 DIAGNOSIS — Z01818 Encounter for other preprocedural examination: Secondary | ICD-10-CM | POA: Insufficient documentation

## 2014-11-16 LAB — CBC
HCT: 38.1 % (ref 36.0–46.0)
HEMOGLOBIN: 12.6 g/dL (ref 12.0–15.0)
MCH: 31.1 pg (ref 26.0–34.0)
MCHC: 33.1 g/dL (ref 30.0–36.0)
MCV: 94.1 fL (ref 78.0–100.0)
Platelets: 224 10*3/uL (ref 150–400)
RBC: 4.05 MIL/uL (ref 3.87–5.11)
RDW: 13.4 % (ref 11.5–15.5)
WBC: 8.8 10*3/uL (ref 4.0–10.5)

## 2014-11-16 NOTE — Patient Instructions (Addendum)
   Your procedure is scheduled on:  Oct 14 (friday)  Enter through the [employer] [facility] at:6am  Pick up the phone at the desk and dial [phone] and inform us of your arrival.  Please call this number if you have any problems the morning of surgery: [phone]  Remember: Do not eat or drink anything after midnight oct 13 (thursday)   Take these medicines the morning of surgery with a SIP OF WATER:  Take prozac and anxiety meds am   Do not wear jewelry, make-up, or FINGER nail polish No metal in your hair or on your body. Do not wear lotions, powders, perfumes.  You may wear deodorant.  Do not bring valuables to the hospital. Contacts, dentures or bridgework may not be worn into surgery  Patients discharged on the day of surgery will not be allowed to drive home.

## 2014-11-20 NOTE — H&P (Signed)
[name] is an 24 y.o. female with chronic pelvic pain.  The patient has had no improvement on hormonal tx.  She has been fully evaluated by GI including multiple scopes and a SBFT; dx: IBS.   Pertinent Gynecological History: Menses: flow is light Bleeding: intermenstrual bleeding Contraception: OCP (estrogen/progesterone) DES exposure: unknown Blood transfusions: none Sexually transmitted diseases: no past history Previous GYN Procedures: none  Last mammogram: n/a Date: n/a Last pap: unknown Date: n/a OB History: G0, P0   Menstrual History: Menarche age: n/a  No LMP recorded.    Past Medical History  Diagnosis Date  . Asthma     "sports induced"  . Pericarditis 2011    post flu    Past Surgical History  Procedure Laterality Date  . Cholecystectomy  11/2010  . Shoulder surgery  06/2010    right  . Knee arthroscopy  11/2006    left  . Cardiac catheterization  Oct. 2013    Family History  Problem Relation Age of Onset  . Heart disease Maternal Grandfather     heart attack  . Cancer Paternal Grandfather     prostate    Social History:  reports that she has never smoked. She has never used smokeless tobacco. She reports that she uses illicit drugs (Marijuana). She reports that she does not drink alcohol.  Allergies:  Allergies  Allergen Reactions  . Benadryl [Diphenhydramine Hcl] Other (See Comments)    Liquid capsules only. Aggressive behavior     No prescriptions prior to admission    ROS  There were no vitals taken for this visit. Physical Exam  Constitutional: She is oriented to person, place, and time. She appears well-developed and well-nourished.  GI: Soft. There is no rebound and no guarding.  Neurological: She is alert and oriented to person, place, and time.  Skin: Skin is warm and dry.  Psychiatric: She has a normal mood and affect. Her behavior is normal.    No results found for this or any previous visit (from the past 24  hour(s)).  No results found.  Assessment/Plan: 23yo with chronic pelvic pain -Plan dx l/s with possible removal of endometriosis -Patient is counseled about risk of bleeding, infection, scarring and damage to surrounding structures.  All questions were answered and the patient wishes to proceed.  Mary Hockey 11/20/2014, 3:14 PM

## 2014-11-23 ENCOUNTER — Ambulatory Visit (HOSPITAL_COMMUNITY): Payer: PRIVATE HEALTH INSURANCE | Admitting: Anesthesiology

## 2014-11-23 ENCOUNTER — Encounter (HOSPITAL_COMMUNITY): Payer: Self-pay | Admitting: Anesthesiology

## 2014-11-23 ENCOUNTER — Ambulatory Visit (HOSPITAL_COMMUNITY)
Admission: RE | Admit: 2014-11-23 | Discharge: 2014-11-23 | Disposition: A | Discharge status: Home / Self Care | Payer: PRIVATE HEALTH INSURANCE | Source: Ambulatory Visit | Attending: Obstetrics & Gynecology | Admitting: Obstetrics & Gynecology

## 2014-11-23 ENCOUNTER — Encounter (HOSPITAL_COMMUNITY)
Admission: RE | Disposition: A | Discharge status: Home / Self Care | Payer: Self-pay | Source: Ambulatory Visit | Attending: Obstetrics & Gynecology

## 2014-11-23 DIAGNOSIS — Z888 Allergy status to other drugs, medicaments and biological substances status: Secondary | ICD-10-CM | POA: Diagnosis not present

## 2014-11-23 DIAGNOSIS — R102 Pelvic and perineal pain: Secondary | ICD-10-CM | POA: Diagnosis not present

## 2014-11-23 DIAGNOSIS — G8929 Other chronic pain: Secondary | ICD-10-CM | POA: Insufficient documentation

## 2014-11-23 DIAGNOSIS — J45990 Exercise induced bronchospasm: Secondary | ICD-10-CM | POA: Diagnosis not present

## 2014-11-23 HISTORY — PX: LAPAROSCOPY: SHX197

## 2014-11-23 LAB — HCG, SERUM, QUALITATIVE: PREG SERUM: NEGATIVE

## 2014-11-23 SURGERY — LAPAROSCOPY, DIAGNOSTIC
Anesthesia: General

## 2014-11-23 MED ORDER — DEXAMETHASONE SODIUM PHOSPHATE 10 MG/ML IJ SOLN
INTRAMUSCULAR | Status: DC | PRN
  Administered 2014-11-23: 4 mg via INTRAVENOUS

## 2014-11-23 MED ORDER — MIDAZOLAM HCL 2 MG/2ML IJ SOLN
INTRAMUSCULAR | Status: DC | PRN
  Administered 2014-11-23: 2 mg via INTRAVENOUS

## 2014-11-23 MED ORDER — OXYCODONE HCL 5 MG PO TABS
5.0000 mg | ORAL_TABLET | Freq: Once | ORAL | Status: AC | PRN
  Administered 2014-11-23: 5 mg via ORAL

## 2014-11-23 MED ORDER — BUPIVACAINE HCL (PF) 0.25 % IJ SOLN
INTRAMUSCULAR | Status: AC
  Filled 2014-11-23: qty 30

## 2014-11-23 MED ORDER — OXYCODONE-ACETAMINOPHEN 5-325 MG PO TABS
1.0000 | ORAL_TABLET | ORAL | Status: DC | PRN
Start: 2014-11-23 — End: 2015-03-29

## 2014-11-23 MED ORDER — PROPOFOL 10 MG/ML IV BOLUS
INTRAVENOUS | Status: AC
  Filled 2014-11-23: qty 20

## 2014-11-23 MED ORDER — DEXAMETHASONE SODIUM PHOSPHATE 4 MG/ML IJ SOLN
INTRAMUSCULAR | Status: AC
  Filled 2014-11-23: qty 1

## 2014-11-23 MED ORDER — SODIUM CHLORIDE 0.9 % IJ SOLN
INTRAMUSCULAR | Status: DC | PRN
  Administered 2014-11-23: 10 mL via INTRAVENOUS

## 2014-11-23 MED ORDER — METOCLOPRAMIDE HCL 5 MG/ML IJ SOLN
INTRAMUSCULAR | Status: DC | PRN
  Administered 2014-11-23 (×2): 5 mg via INTRAVENOUS

## 2014-11-23 MED ORDER — NEOSTIGMINE METHYLSULFATE 10 MG/10ML IV SOLN
INTRAVENOUS | Status: DC | PRN
  Administered 2014-11-23: 4 mg via INTRAVENOUS

## 2014-11-23 MED ORDER — LACTATED RINGERS IV SOLN: INTRAVENOUS | Status: DC

## 2014-11-23 MED ORDER — GLYCOPYRROLATE 0.2 MG/ML IJ SOLN
INTRAMUSCULAR | Status: AC
  Filled 2014-11-23: qty 5

## 2014-11-23 MED ORDER — PROPOFOL 10 MG/ML IV BOLUS
INTRAVENOUS | Status: DC | PRN
  Administered 2014-11-23: 200 mg via INTRAVENOUS

## 2014-11-23 MED ORDER — SCOPOLAMINE 1 MG/3DAYS TD PT72
1.0000 | MEDICATED_PATCH | Freq: Once | TRANSDERMAL | Status: DC
  Administered 2014-11-23: 1.5 mg via TRANSDERMAL

## 2014-11-23 MED ORDER — ROCURONIUM BROMIDE 100 MG/10ML IV SOLN
INTRAVENOUS | Status: AC
  Filled 2014-11-23: qty 1

## 2014-11-23 MED ORDER — SCOPOLAMINE 1 MG/3DAYS TD PT72
MEDICATED_PATCH | TRANSDERMAL | Status: AC
  Administered 2014-11-23: 1.5 mg via TRANSDERMAL
  Filled 2014-11-23: qty 1

## 2014-11-23 MED ORDER — MEPERIDINE HCL 25 MG/ML IJ SOLN: 6.2500 mg | INTRAMUSCULAR | Status: DC | PRN

## 2014-11-23 MED ORDER — ONDANSETRON HCL 4 MG/2ML IJ SOLN
INTRAMUSCULAR | Status: DC | PRN
  Administered 2014-11-23: 4 mg via INTRAVENOUS

## 2014-11-23 MED ORDER — LIDOCAINE HCL (CARDIAC) 20 MG/ML IV SOLN
INTRAVENOUS | Status: DC | PRN
  Administered 2014-11-23: 40 mg via INTRAVENOUS

## 2014-11-23 MED ORDER — ALBUTEROL SULFATE HFA 108 (90 BASE) MCG/ACT IN AERS
INHALATION_SPRAY | RESPIRATORY_TRACT | Status: AC
  Filled 2014-11-23: qty 6.7

## 2014-11-23 MED ORDER — ONDANSETRON HCL 4 MG/2ML IJ SOLN: 4.0000 mg | Freq: Once | INTRAMUSCULAR | Status: DC | PRN

## 2014-11-23 MED ORDER — BUPIVACAINE HCL (PF) 0.25 % IJ SOLN
INTRAMUSCULAR | Status: DC | PRN
  Administered 2014-11-23: 10 mL

## 2014-11-23 MED ORDER — GLYCOPYRROLATE 0.2 MG/ML IJ SOLN
INTRAMUSCULAR | Status: DC | PRN
  Administered 2014-11-23: 0.6 mg via INTRAVENOUS

## 2014-11-23 MED ORDER — FENTANYL CITRATE (PF) 250 MCG/5ML IJ SOLN
INTRAMUSCULAR | Status: AC
  Filled 2014-11-23: qty 25

## 2014-11-23 MED ORDER — MIDAZOLAM HCL 2 MG/2ML IJ SOLN
INTRAMUSCULAR | Status: AC
  Filled 2014-11-23: qty 4

## 2014-11-23 MED ORDER — FENTANYL CITRATE (PF) 100 MCG/2ML IJ SOLN
INTRAMUSCULAR | Status: DC | PRN
  Administered 2014-11-23 (×2): 50 ug via INTRAVENOUS
  Administered 2014-11-23: 100 ug via INTRAVENOUS
  Administered 2014-11-23 (×2): 25 ug via INTRAVENOUS

## 2014-11-23 MED ORDER — OXYCODONE HCL 5 MG/5ML PO SOLN: 5.0000 mg | Freq: Once | ORAL | Status: AC | PRN

## 2014-11-23 MED ORDER — FENTANYL CITRATE (PF) 100 MCG/2ML IJ SOLN
INTRAMUSCULAR | Status: AC
  Filled 2014-11-23: qty 2

## 2014-11-23 MED ORDER — LIDOCAINE HCL (CARDIAC) 20 MG/ML IV SOLN
INTRAVENOUS | Status: AC
  Filled 2014-11-23: qty 5

## 2014-11-23 MED ORDER — SODIUM CHLORIDE 0.9 % IJ SOLN
INTRAMUSCULAR | Status: AC
  Filled 2014-11-23: qty 10

## 2014-11-23 MED ORDER — OXYCODONE HCL 5 MG PO TABS
ORAL_TABLET | ORAL | Status: AC
  Filled 2014-11-23: qty 1

## 2014-11-23 MED ORDER — ALBUTEROL SULFATE HFA 108 (90 BASE) MCG/ACT IN AERS
2.0000 | INHALATION_SPRAY | Freq: Once | RESPIRATORY_TRACT | Status: AC
  Administered 2014-11-23: 2 via RESPIRATORY_TRACT

## 2014-11-23 MED ORDER — ROCURONIUM BROMIDE 100 MG/10ML IV SOLN
INTRAVENOUS | Status: DC | PRN
  Administered 2014-11-23: 10 mg via INTRAVENOUS
  Administered 2014-11-23: 20 mg via INTRAVENOUS

## 2014-11-23 MED ORDER — ONDANSETRON HCL 4 MG/2ML IJ SOLN
INTRAMUSCULAR | Status: AC
  Filled 2014-11-23: qty 2

## 2014-11-23 MED ORDER — LACTATED RINGERS IV SOLN
INTRAVENOUS | Status: DC
  Administered 2014-11-23 (×2): via INTRAVENOUS

## 2014-11-23 MED ORDER — FENTANYL CITRATE (PF) 100 MCG/2ML IJ SOLN
25.0000 ug | INTRAMUSCULAR | Status: DC | PRN
  Administered 2014-11-23 (×2): 50 ug via INTRAVENOUS

## 2014-11-23 SURGICAL SUPPLY — 29 items
BARRIER ADHS 3X4 INTERCEED (GAUZE/BANDAGES/DRESSINGS) IMPLANT
CABLE HIGH FREQUENCY MONO STRZ (ELECTRODE) IMPLANT
CATH ROBINSON RED A/P 16FR (CATHETERS) ×3 IMPLANT
CHLORAPREP W/TINT 26ML (MISCELLANEOUS) ×3 IMPLANT
CLOSURE WOUND 1/2 X4 (GAUZE/BANDAGES/DRESSINGS)
CLOTH BEACON ORANGE TIMEOUT ST (SAFETY) ×3 IMPLANT
DRSG COVADERM PLUS 2X2 (GAUZE/BANDAGES/DRESSINGS) ×3 IMPLANT
DRSG OPSITE POSTOP 3X4 (GAUZE/BANDAGES/DRESSINGS) ×3 IMPLANT
FORCEPS CUTTING 33CM 5MM (CUTTING FORCEPS) IMPLANT
GLOVE BIO SURGEON STRL SZ 6 (GLOVE) ×6 IMPLANT
GLOVE BIOGEL PI IND STRL 6 (GLOVE) ×2 IMPLANT
GLOVE BIOGEL PI INDICATOR 6 (GLOVE) ×4
GOWN STRL REUS W/TWL LRG LVL3 (GOWN DISPOSABLE) ×9 IMPLANT
LIQUID BAND (GAUZE/BANDAGES/DRESSINGS) ×3 IMPLANT
NEEDLE INSUFFLATION 120MM (ENDOMECHANICALS) ×3 IMPLANT
NS IRRIG 1000ML POUR BTL (IV SOLUTION) ×3 IMPLANT
PACK LAPAROSCOPY BASIN (CUSTOM PROCEDURE TRAY) ×3 IMPLANT
PAD POSITIONING PINK XL (MISCELLANEOUS) ×3 IMPLANT
POUCH SPECIMEN RETRIEVAL 10MM (ENDOMECHANICALS) IMPLANT
SET IRRIG TUBING LAPAROSCOPIC (IRRIGATION / IRRIGATOR) IMPLANT
STRIP CLOSURE SKIN 1/2X4 (GAUZE/BANDAGES/DRESSINGS) IMPLANT
SUT MNCRL AB 3-0 PS2 27 (SUTURE) ×3 IMPLANT
SUT VICRYL 0 UR6 27IN ABS (SUTURE) ×3 IMPLANT
TOWEL OR 17X24 6PK STRL BLUE (TOWEL DISPOSABLE) ×6 IMPLANT
TROCAR XCEL NON-BLD 11X100MML (ENDOMECHANICALS) ×3 IMPLANT
TROCAR XCEL NON-BLD 5MMX100MML (ENDOMECHANICALS) ×6 IMPLANT
TUBING INSUFFLATOR W/FILTER (MISCELLANEOUS) ×3 IMPLANT
WARMER LAPAROSCOPE (MISCELLANEOUS) ×3 IMPLANT
WATER STERILE IRR 1000ML POUR (IV SOLUTION) ×3 IMPLANT

## 2014-11-23 NOTE — Discharge Instructions (Signed)
Call MD for T>100.4, severe abdominal pain, intractable nausea and/or vomiting, or respiratory distress.  Call office to schedule 2 week postop appointment.  No driving while taking narcotics.

## 2014-11-23 NOTE — Anesthesia Postprocedure Evaluation (Signed)
Anesthesia Post Note  Patient: Nicole FuchsMonica N Andrade  Procedure(s) Performed: Procedure(s) (LRB): LAPAROSCOPY DIAGNOSTIC possible removal of endometriosis (N/A)  Anesthesia type: General  Patient location: PACU  Post pain: Pain level controlled  Post assessment: Post-op Vital signs reviewed  Last Vitals:  Filed Vitals:   11/23/14 0845  BP: 119/66  Pulse:   Temp: 36.9 C  Resp:     Post vital signs: Reviewed  Level of consciousness: sedated  Complications: No apparent anesthesia complications

## 2014-11-23 NOTE — Op Note (Signed)
[name] 11/23/2014  PREOPERATIVE DIAGNOSIS: Chronic pelvic pain  POSTOPERATIVE DIAGNOSIS: SAA  PROCEDURE: Diagnostic laparoscopy  ANESTHESIA: General endotracheal  COMPLICATIONS: None immediate.  ESTIMATED BLOOD LOSS: Less than 25 ml.  PATHOLOGY: None  INDICATIONS: 24 y.o. with chronic pelvic pain refractory of hormonal treatment.   FINDINGS: Normal uterus, normal fallopian tubes and ovaries bilaterally. Normal appearing appendix and liver edge.  Surgically absent gallbladder. Normal appearing peritoneum without suspicious lesions for endometriosis.   TECHNIQUE: The patient was taken to the operating room where general anesthesia was obtained without difficulty. She was then placed in the dorsal lithotomy position and prepared and draped in sterile fashion. After an adequate timeout was performed, a bivalved speculum was then placed in the patient's vagina, and the anterior lip of cervix grasped with the single-tooth tenaculum. The hulka clip was advanced into the uterus. The speculum was removed from the vagina.  Attention was then turned to the patient's abdomen where a 10-mm skin incision was made on the umbilical fold. The Veress needle was carefully introduced into the peritoneal cavity through the abdominal wall.Intraperitoneal placement was confirmed with appropriate intraabdominal pressure with insufflation of carbon dioxide gas. Adequate pneumoperitoneum was obtained, and the 10 mm trocar was then advanced without difficulty into the abdomen.  Concern for infraumbilical adhesions prompted placement of 5 mm trocar at Palmer's point.  Using 5mm scope, survey of abdomen showed tip of trocar incompletely penetrating peritoneum.  10 mm trocar was correctly positioned and general survey of abdomen was performed using blunt probe from the left upper quadrant port for retraction.  Entirely benign appearing abdomen and pelvis noted. Instruments were removed from  the abdomen. Fascial incision was closed with interrupted 2-0 vicryl suture. Skin incisions were closed with 3-0 monocryl in a subcuticular fashion. Band aids were applied to each incision.  The uterine manipulator and the tenaculum were removed from the vagina without complications.   The patient tolerated the procedure well. Sponge, lap, and needle counts were correct times two. The patient was then taken to the recovery room awake, extubated and in stable in stable condition.

## 2014-11-23 NOTE — Anesthesia Procedure Notes (Signed)
Procedure Name: Intubation Date/Time: 11/23/2014 7:52 AM Performed by: Correna Meacham G Pre-anesthesia Checklist: Patient identified, Emergency Drugs available, Suction available, Patient being monitored and Timeout performed Patient Re-evaluated:Patient Re-evaluated prior to inductionOxygen Delivery Method: Circle system utilized Preoxygenation: Pre-oxygenation with 100% oxygen Intubation Type: IV induction Ventilation: Mask ventilation without difficulty Laryngoscope Size: Mac and 2 Grade View: Grade I Tube type: Oral Tube size: 7.0 mm Number of attempts: 1 Airway Equipment and Method: Stylet Placement Confirmation: ETT inserted through vocal cords under direct vision,  positive ETCO2 and breath sounds checked- equal and bilateral Secured at: 22 cm Tube secured with: Tape Dental Injury: Teeth and Oropharynx as per pre-operative assessment

## 2014-11-23 NOTE — Anesthesia Preprocedure Evaluation (Signed)
Anesthesia Evaluation  Patient identified by MRN, date of birth, ID band Patient awake    Reviewed: Allergy & Precautions, H&P , NPO status , Patient's Chart, lab work & pertinent test results, reviewed documented beta blocker date and time   Airway Mallampati: I  TM Distance: >3 FB Neck ROM: full    Dental no notable dental hx. (+) Teeth Intact   Pulmonary    Pulmonary exam normal        Cardiovascular negative cardio ROS Normal cardiovascular exam     Neuro/Psych negative neurological ROS  negative psych ROS   GI/Hepatic negative GI ROS, Neg liver ROS,   Endo/Other  negative endocrine ROS  Renal/GU negative Renal ROS     Musculoskeletal   Abdominal Normal abdominal exam  (+)   Peds  Hematology negative hematology ROS (+)   Anesthesia Other Findings   Reproductive/Obstetrics negative OB ROS                             Anesthesia Physical Anesthesia Plan  ASA: II  Anesthesia Plan: General   Post-op Pain Management:    Induction: Intravenous  Airway Management Planned: Oral ETT  Additional Equipment:   Intra-op Plan:   Post-operative Plan: Extubation in OR  Informed Consent: I have reviewed the patients History and Physical, chart, labs and discussed the procedure including the risks, benefits and alternatives for the proposed anesthesia with the patient or authorized representative who has indicated his/her understanding and acceptance.   Dental Advisory Given  Plan Discussed with: CRNA and Surgeon  Anesthesia Plan Comments:         Anesthesia Quick Evaluation

## 2014-11-23 NOTE — Transfer of Care (Signed)
Immediate Anesthesia Transfer of Care Note  Patient: Nicole FuchsMonica N Thurmon  Procedure(s) Performed: Procedure(s): LAPAROSCOPY DIAGNOSTIC possible removal of endometriosis (N/A)  Patient Location: PACU  Anesthesia Type:General  Level of Consciousness: awake and alert   Airway & Oxygen Therapy: Patient Spontanous Breathing and Patient connected to nasal cannula oxygen  Post-op Assessment: Report given to RN and Post -op Vital signs reviewed and stable  Post vital signs: Reviewed and stable  Last Vitals:  Filed Vitals:   11/23/14 0710  BP: 125/73  Pulse: 80  Temp: 37.2 C  Resp: 18    Complications: No apparent anesthesia complications

## 2014-11-23 NOTE — Progress Notes (Signed)
No change to H&P.  Samora Jernberg, DO 

## 2014-11-26 ENCOUNTER — Encounter (HOSPITAL_COMMUNITY): Payer: Self-pay | Admitting: Obstetrics & Gynecology

## 2015-03-29 ENCOUNTER — Emergency Department (HOSPITAL_COMMUNITY)
Admission: EM | Admit: 2015-03-29 | Discharge: 2015-03-29 | Disposition: A | Discharge status: Home / Self Care | Payer: Commercial Indemnity | Source: Home / Self Care | Attending: Family Medicine | Admitting: Family Medicine

## 2015-03-29 ENCOUNTER — Encounter (HOSPITAL_COMMUNITY): Payer: Self-pay | Admitting: Emergency Medicine

## 2015-03-29 DIAGNOSIS — J111 Influenza due to unidentified influenza virus with other respiratory manifestations: Secondary | ICD-10-CM | POA: Diagnosis not present

## 2015-03-29 MED ORDER — OSELTAMIVIR PHOSPHATE 75 MG PO CAPS: 75.0000 mg | ORAL_CAPSULE | Freq: Two times a day (BID) | ORAL | Status: DC

## 2015-03-29 MED ORDER — HYDROCOD POLST-CPM POLST ER 10-8 MG/5ML PO SUER: 5.0000 mL | Freq: Two times a day (BID) | ORAL | Status: DC | PRN

## 2015-03-29 MED ORDER — ONDANSETRON HCL 4 MG PO TABS: 4.0000 mg | ORAL_TABLET | Freq: Four times a day (QID) | ORAL | Status: DC

## 2015-03-29 NOTE — ED Notes (Signed)
C/o cold sx onset yest associated w/fevers, prod cough, chills, chest tightness, HA, BA, and SOB Had ibup earlier today w/temp relief Mom at bedside  A&O x4... No acute distress.

## 2015-03-29 NOTE — Discharge Instructions (Signed)
Influenza, Adult Lots of fluids, primarily clear if vomiting For congestion sudafed PE 10 mg every 4-6h as needed. Saline nasal spray that number should go is not decided several days And also I am going Home, rest Influenza ("the flu") is a viral infection of the respiratory tract. It occurs more often in winter months because people spend more time in close contact with one another. Influenza can make you feel very sick. Influenza easily spreads from person to person (contagious). CAUSES  Influenza is caused by a virus that infects the respiratory tract. You can catch the virus by breathing in droplets from an infected person's cough or sneeze. You can also catch the virus by touching something that was recently contaminated with the virus and then touching your mouth, nose, or eyes. RISKS AND COMPLICATIONS You may be at risk for a more severe case of influenza if you smoke cigarettes, have diabetes, have chronic heart disease (such as heart failure) or lung disease (such as asthma), or if you have a weakened immune system. Elderly people and pregnant women are also at risk for more serious infections. The most common problem of influenza is a lung infection (pneumonia). Sometimes, this problem can require emergency medical care and may be life threatening. SIGNS AND SYMPTOMS  Symptoms typically last 4 to 10 days and may include:  Fever.  Chills.  Headache, body aches, and muscle aches.  Sore throat.  Chest discomfort and cough.  Poor appetite.  Weakness or feeling tired.  Dizziness.  Nausea or vomiting. DIAGNOSIS  Diagnosis of influenza is often made based on your history and a physical exam. A nose or throat swab test can be done to confirm the diagnosis. TREATMENT  In mild cases, influenza goes away on its own. Treatment is directed at relieving symptoms. For more severe cases, your health care provider may prescribe antiviral medicines to shorten the sickness. Antibiotic  medicines are not effective because the infection is caused by a virus, not by bacteria. HOME CARE INSTRUCTIONS  Take medicines only as directed by your health care provider.  Use a cool mist humidifier to make breathing easier.  Get plenty of rest until your temperature returns to normal. This usually takes 3 to 4 days.  Drink enough fluid to keep your urine clear or pale yellow.  Cover yourmouth and nosewhen coughing or sneezing,and wash your handswellto prevent thevirusfrom spreading.  Stay homefromwork orschool untilthe fever is gonefor at least 5full day. PREVENTION  An annual influenza vaccination (flu shot) is the best way to avoid getting influenza. An annual flu shot is now routinely recommended for all adults in the [place] SEEK MEDICAL CARE IF:  You experiencechest pain, yourcough worsens,or you producemore mucus.  Youhave nausea,vomiting, ordiarrhea.  Your fever returns or gets worse. SEEK IMMEDIATE MEDICAL CARE IF:  You havetrouble breathing, you become short of breath,or your skin ornails becomebluish.  You have severe painor stiffnessin the neck.  You develop a sudden headache, or pain in the face or ear.  You have nausea or vomiting that you cannot control. MAKE SURE YOU:   Understand these instructions.  Will watch your condition.  Will get help right away if you are not doing well or get worse.   This information is not intended to replace advice given to you by your health care provider. Make sure you discuss any questions you have with your health care provider.   Document Released: 01/24/2000 Document Revised: 02/16/2014 Document Reviewed: 04/27/2011 Elsevier Interactive Patient Education 2016 [employer] ° °

## 2015-03-29 NOTE — ED Provider Notes (Signed)
CSN: [id number]     Arrival date & time 03/29/15  1300 History   First MD Initiated Contact with Patient 03/29/15 1307     Chief Complaint  Patient presents with  . URI   (Consider location/radiation/quality/duration/timing/severity/associated sxs/prior Treatment) HPI Comments: 26 year old female with rather sudden onset of symptoms beginning yesterday approximately 4 PM. She is complaining of tightness in the chest, frequent cough, fever ranging 102-103, headache, myalgias, joint pains, fatigue and malaise and weakness. She had one episode of vomiting yesterday. She is able to hold down liquids without vomiting. She did not receive a flu vaccination this season.   Past Medical History  Diagnosis Date  . Asthma     "sports induced"  . Pericarditis 2011    post flu   Past Surgical History  Procedure Laterality Date  . Cholecystectomy  11/2010  . Shoulder surgery  06/2010    right  . Knee arthroscopy  11/2006    left  . Cardiac catheterization  Oct. 2013  . Laparoscopy N/A 11/23/2014    Procedure: LAPAROSCOPY DIAGNOSTIC possible removal of endometriosis;  Surgeon: Mitchel Honour, DO;  Location: [facility];  Service: Gynecology;  Laterality: N/A;   Family History  Problem Relation Age of Onset  . Heart disease Maternal Grandfather     heart attack  . Cancer Paternal Grandfather     prostate   Social History  Substance Use Topics  . Smoking status: Never Smoker   . Smokeless tobacco: Never Used  . Alcohol Use: No   OB History    No data available     Review of Systems  Constitutional: Positive for fever, chills, activity change, appetite change, fatigue and unexpected weight change.  HENT: Positive for congestion, postnasal drip, rhinorrhea and trouble swallowing. Negative for ear pain and facial swelling.   Eyes: Negative.   Respiratory: Positive for cough and shortness of breath.   Cardiovascular: Positive for chest pain.       Chest pain with cough  Gastrointestinal:      Vomited once. Hx of unknown type GI condition which is causing wt loss, decrease appetite and nausea  Musculoskeletal: Positive for myalgias and back pain. Negative for neck pain and neck stiffness.  Skin: Negative for pallor and rash.  Neurological: Positive for headaches. Negative for seizures.  Psychiatric/Behavioral: Negative.   All other systems reviewed and are negative.   Allergies  Benadryl  Home Medications   Prior to Admission medications   Medication Sig Start Date End Date Taking? Authorizing Provider  albuterol (PROVENTIL HFA;VENTOLIN HFA) 108 (90 BASE) MCG/ACT inhaler Inhale 2 puffs into the lungs every 6 (six) hours as needed for wheezing or shortness of breath.   Yes Historical Provider, MD  amitriptyline (ELAVIL) 10 MG tablet Take 10 mg by mouth at bedtime.  11/03/13  Yes Historical Provider, MD  amphetamine-dextroamphetamine (ADDERALL) 20 MG tablet Take 10 mg by mouth daily.  09/14/13  Yes Historical Provider, MD  clonazePAM (KLONOPIN) 0.5 MG tablet Take 0.5 mg by mouth daily as needed for anxiety.  11/07/13  Yes Historical Provider, MD  FLUoxetine (PROZAC) 20 MG tablet Take 20 mg by mouth daily.   Yes Historical Provider, MD  ibuprofen (ADVIL,MOTRIN) 600 MG tablet Take 1 tablet (600 mg total) by mouth every 6 (six) hours as needed for pain. 11/30/11  Yes Fayrene Helper, PA-C  norgestimate-ethinyl estradiol (MONONESSA) 0.25-35 MG-MCG tablet Take 1 tablet by mouth at bedtime.   Yes Historical Provider, MD  acetaminophen (TYLENOL) 500 MG tablet  Take 1,000 mg by mouth every 6 (six) hours as needed for moderate pain or headache.    Historical Provider, MD  chlorpheniramine-HYDROcodone (TUSSIONEX PENNKINETIC ER) 10-8 MG/5ML SUER Take 5 mLs by mouth every 12 (twelve) hours as needed for cough. 03/29/15   Hayden Rasmussen, NP  fluticasone (FLONASE) 50 MCG/ACT nasal spray Place 2 sprays into both nostrils daily as needed for allergies or rhinitis.    Historical Provider, MD  ondansetron  (ZOFRAN) 4 MG tablet Take 1 tablet (4 mg total) by mouth every 6 (six) hours. 03/29/15   Hayden Rasmussen, NP  ondansetron (ZOFRAN-ODT) 4 MG disintegrating tablet Take 4 mg by mouth every 8 (eight) hours as needed for nausea or vomiting.    Historical Provider, MD  oseltamivir (TAMIFLU) 75 MG capsule Take 1 capsule (75 mg total) by mouth 2 (two) times daily. X 5 days 03/29/15   Hayden Rasmussen, NP  pantoprazole (PROTONIX) 40 MG tablet Take 40 mg by mouth daily.    Historical Provider, MD  promethazine (PHENERGAN) 25 MG tablet Take 25 mg by mouth every 6 (six) hours as needed for nausea or vomiting.    Historical Provider, MD  SUMAtriptan (IMITREX) 100 MG tablet Take 100 mg by mouth every 2 (two) hours as needed for migraine or headache. May repeat in 2 hours if headache persists or recurs.    Historical Provider, MD   Meds Ordered and Administered this Visit  Medications - No data to display  BP 107/73 mmHg  Pulse 141  Temp(Src) 101.1 F (38.4 C) (Oral)  Resp 18  SpO2 98%  LMP 03/29/2015 No data found.   Physical Exam  Constitutional: She is oriented to person, place, and time. She appears well-developed and well-nourished.  Appears moderately ill but in no acute distress.  HENT:  Mouth/Throat: No oropharyngeal exudate.  Bilateral TMs are normal. Oropharynx with minor erythema and clear PND. No exudates. The swelling.  Eyes: Conjunctivae and EOM are normal.  Neck: Normal range of motion. Neck supple.  Cardiovascular: Regular rhythm, normal heart sounds and intact distal pulses.   No murmur heard. Tachycardia  Pulmonary/Chest: Effort normal and breath sounds normal. No respiratory distress. She has no wheezes. She has no rales.  Abdominal: Soft.  Minor nonspecific abdominal tenderness which is chronic.  Musculoskeletal: Normal range of motion. She exhibits no edema.  Lymphadenopathy:    She has no cervical adenopathy.  Neurological: She is alert and oriented to person, place, and time. No  cranial nerve deficit. She exhibits normal muscle tone.  Skin: Skin is warm and dry. No rash noted.  Psychiatric: She has a normal mood and affect. Her behavior is normal.  Nursing note and vitals reviewed.   ED Course  Procedures (including critical care time)  Labs Review Labs Reviewed - No data to display  Imaging Review No results found.   Visual Acuity Review  Right Eye Distance:   Left Eye Distance:   Bilateral Distance:    Right Eye Near:   Left Eye Near:    Bilateral Near:         MDM   1. Influenza    Lots of fluids, primarily clear if vomiting For congestion sudafed PE 10 mg every 4-6h as needed. Saline nasal spray Home, rest Meds ordered this encounter  Medications  . oseltamivir (TAMIFLU) 75 MG capsule    Sig: Take 1 capsule (75 mg total) by mouth 2 (two) times daily. X 5 days    Dispense:  10 capsule  Refill:  0    Order Specific Question:  Supervising Provider    Answer:  Linna Hoff [phone]  . chlorpheniramine-HYDROcodone (TUSSIONEX PENNKINETIC ER) 10-8 MG/5ML SUER    Sig: Take 5 mLs by mouth every 12 (twelve) hours as needed for cough.    Dispense:  90 mL    Refill:  0    Order Specific Question:  Supervising Provider    Answer:  Linna Hoff [phone]  . ondansetron (ZOFRAN) 4 MG tablet    Sig: Take 1 tablet (4 mg total) by mouth every 6 (six) hours.    Dispense:  12 tablet    Refill:  0    Order Specific Question:  Supervising Provider    Answer:  Linna Hoff [[id number]]       Hayden Rasmussen, NP 03/29/15 [phone]

## 2015-09-12 ENCOUNTER — Encounter (HOSPITAL_COMMUNITY): Payer: Self-pay | Admitting: *Deleted

## 2015-09-12 ENCOUNTER — Emergency Department (HOSPITAL_COMMUNITY)
Admission: EM | Admit: 2015-09-12 | Discharge: 2015-09-12 | Disposition: A | Discharge status: Home / Self Care | Payer: Worker's Compensation | Attending: Emergency Medicine | Admitting: Emergency Medicine

## 2015-09-12 ENCOUNTER — Emergency Department (HOSPITAL_COMMUNITY): Payer: Worker's Compensation

## 2015-09-12 DIAGNOSIS — Y939 Activity, unspecified: Secondary | ICD-10-CM | POA: Diagnosis not present

## 2015-09-12 DIAGNOSIS — S060X9A Concussion with loss of consciousness of unspecified duration, initial encounter: Secondary | ICD-10-CM | POA: Insufficient documentation

## 2015-09-12 DIAGNOSIS — S022XXA Fracture of nasal bones, initial encounter for closed fracture: Secondary | ICD-10-CM | POA: Diagnosis not present

## 2015-09-12 DIAGNOSIS — J45909 Unspecified asthma, uncomplicated: Secondary | ICD-10-CM | POA: Insufficient documentation

## 2015-09-12 DIAGNOSIS — W03XXXA Other fall on same level due to collision with another person, initial encounter: Secondary | ICD-10-CM | POA: Insufficient documentation

## 2015-09-12 DIAGNOSIS — Y929 Unspecified place or not applicable: Secondary | ICD-10-CM | POA: Insufficient documentation

## 2015-09-12 DIAGNOSIS — S0990XA Unspecified injury of head, initial encounter: Secondary | ICD-10-CM

## 2015-09-12 DIAGNOSIS — Y990 Civilian activity done for income or pay: Secondary | ICD-10-CM | POA: Diagnosis not present

## 2015-09-12 DIAGNOSIS — S0992XA Unspecified injury of nose, initial encounter: Secondary | ICD-10-CM | POA: Diagnosis present

## 2015-09-12 LAB — POC URINE PREG, ED: Preg Test, Ur: NEGATIVE

## 2015-09-12 MED ORDER — IBUPROFEN 600 MG PO TABS: 600.0000 mg | ORAL_TABLET | Freq: Four times a day (QID) | ORAL | 0 refills | Status: DC | PRN

## 2015-09-12 MED ORDER — ACETAMINOPHEN 500 MG PO TABS: 500.0000 mg | ORAL_TABLET | Freq: Four times a day (QID) | ORAL | 0 refills | Status: AC | PRN

## 2015-09-12 MED ORDER — ACETAMINOPHEN 500 MG PO TABS
1000.0000 mg | ORAL_TABLET | Freq: Once | ORAL | Status: AC
  Administered 2015-09-12: 1000 mg via ORAL
  Filled 2015-09-12: qty 2

## 2015-09-12 NOTE — ED Provider Notes (Signed)
[facility]-EMERGENCY DEPT Provider Note   CSN: [id number] Arrival date & time: 09/12/15  1004  First Provider Contact:  First MD Initiated Contact with Patient 09/12/15 1027     History   Chief Complaint Chief Complaint  Patient presents with  . Head Injury  . Loss of Consciousness    HPI TENLEE WOLLIN is a 25 y.o. female.  The history is provided by the patient. No language interpreter was used.  Loss of Consciousness   This is a new problem. The problem has been resolved. She lost consciousness for a period of less than one minute. Associated with: Hit on head, blood loss (minimal) Pertinent negatives include abdominal pain, back pain, bladder incontinence, bowel incontinence, chest pain, clumsiness, confusion, congestion, diaphoresis, dizziness, fever, focal sensory loss, focal weakness, headaches, light-headedness, malaise/fatigue, nausea, palpitations, seizures, slurred speech, vertigo, visual change, vomiting and weakness. She has tried ice for the symptoms. The treatment provided moderate relief. Her past medical history does not include CAD, CVA, DM, HTN, seizures or vertigo.    Past Medical History:  Diagnosis Date  . Asthma    "sports induced"  . Pericarditis 2011   post flu    Patient Active Problem List   Diagnosis Date Noted  . Small bowel obstruction (HCC) 05/28/2014  . Abdominal pain 05/28/2014  . Asthma 05/28/2014  . Nausea vomiting and diarrhea 05/28/2014  . Postop check 12/16/2010    Past Surgical History:  Procedure Laterality Date  . CARDIAC CATHETERIZATION  Oct. 2013  . CHOLECYSTECTOMY  11/2010  . KNEE ARTHROSCOPY  11/2006   left  . LAPAROSCOPY N/A 11/23/2014   Procedure: LAPAROSCOPY DIAGNOSTIC possible removal of endometriosis;  Surgeon: Mitchel Honour, DO;  Location: [facility];  Service: Gynecology;  Laterality: N/A;  . SHOULDER SURGERY  06/2010   right    OB History    No data available       Home Medications    Prior to Admission medications    Medication Sig Start Date End Date Taking? Authorizing Provider  acetaminophen (TYLENOL) 500 MG tablet Take 1 tablet (500 mg total) by mouth every 6 (six) hours as needed. 09/12/15   Dan Humphreys, MD  albuterol (PROVENTIL HFA;VENTOLIN HFA) 108 (90 BASE) MCG/ACT inhaler Inhale 2 puffs into the lungs every 6 (six) hours as needed for wheezing or shortness of breath.    Historical Provider, MD  amitriptyline (ELAVIL) 10 MG tablet Take 10 mg by mouth at bedtime.  11/03/13   Historical Provider, MD  amphetamine-dextroamphetamine (ADDERALL) 20 MG tablet Take 10 mg by mouth daily.  09/14/13   Historical Provider, MD  chlorpheniramine-HYDROcodone (TUSSIONEX PENNKINETIC ER) 10-8 MG/5ML SUER Take 5 mLs by mouth every 12 (twelve) hours as needed for cough. 03/29/15   Hayden Rasmussen, NP  clonazePAM (KLONOPIN) 0.5 MG tablet Take 0.5 mg by mouth daily as needed for anxiety.  11/07/13   Historical Provider, MD  FLUoxetine (PROZAC) 20 MG tablet Take 20 mg by mouth daily.    Historical Provider, MD  fluticasone (FLONASE) 50 MCG/ACT nasal spray Place 2 sprays into both nostrils daily as needed for allergies or rhinitis.    Historical Provider, MD  ibuprofen (ADVIL,MOTRIN) 600 MG tablet Take 1 tablet (600 mg total) by mouth every 6 (six) hours as needed. 09/12/15   Dan Humphreys, MD  norgestimate-ethinyl estradiol (MONONESSA) 0.25-35 MG-MCG tablet Take 1 tablet by mouth at bedtime.    Historical Provider, MD  ondansetron (ZOFRAN) 4 MG tablet Take 1 tablet (4 mg total) by  mouth every 6 (six) hours. 03/29/15   Hayden Rasmussen, NP  ondansetron (ZOFRAN-ODT) 4 MG disintegrating tablet Take 4 mg by mouth every 8 (eight) hours as needed for nausea or vomiting.    Historical Provider, MD  oseltamivir (TAMIFLU) 75 MG capsule Take 1 capsule (75 mg total) by mouth 2 (two) times daily. X 5 days 03/29/15   Hayden Rasmussen, NP  pantoprazole (PROTONIX) 40 MG tablet Take 40 mg by mouth daily.    Historical Provider, MD  promethazine (PHENERGAN) 25 MG tablet  Take 25 mg by mouth every 6 (six) hours as needed for nausea or vomiting.    Historical Provider, MD  SUMAtriptan (IMITREX) 100 MG tablet Take 100 mg by mouth every 2 (two) hours as needed for migraine or headache. May repeat in 2 hours if headache persists or recurs.    Historical Provider, MD    Family History Family History  Problem Relation Age of Onset  . Heart disease Maternal Grandfather     heart attack  . Cancer Paternal Grandfather     prostate    Social History Social History  Substance Use Topics  . Smoking status: Never Smoker  . Smokeless tobacco: Never Used  . Alcohol use No     Allergies   Benadryl [diphenhydramine hcl]   Review of Systems Review of Systems  Constitutional: Negative for chills, diaphoresis, fever and malaise/fatigue.  HENT: Negative for congestion, ear pain and sore throat.   Eyes: Negative for pain and visual disturbance.  Respiratory: Negative for cough and shortness of breath.   Cardiovascular: Positive for syncope. Negative for chest pain and palpitations.  Gastrointestinal: Negative for abdominal pain, bowel incontinence, nausea and vomiting.  Genitourinary: Negative for bladder incontinence, dysuria and hematuria.  Musculoskeletal: Negative for arthralgias and back pain.  Skin: Negative for color change and rash.  Neurological: Positive for syncope. Negative for dizziness, vertigo, focal weakness, seizures, weakness, light-headedness and headaches.  Psychiatric/Behavioral: Negative for confusion.  All other systems reviewed and are negative.    Physical Exam Updated Vital Signs BP 105/71 (BP Location: Right Arm)   Pulse 84   Temp 98.7 F (37.1 C) (Oral)   Resp 19   Ht 5\' 3"  (1.6 m)   Wt 56.7 kg   SpO2 100%   BMI 22.14 kg/m   Physical Exam  Constitutional: She is oriented to person, place, and time. She appears well-developed and well-nourished. No distress.  HENT:  Head: Normocephalic and atraumatic. Head is without  raccoon's eyes, without Battle's sign, without right periorbital erythema and without left periorbital erythema.    Eyes: Conjunctivae are normal.  Neck: Neck supple.  Cardiovascular: Normal rate and regular rhythm.   No murmur heard. Pulmonary/Chest: Effort normal and breath sounds normal. No respiratory distress.  Abdominal: Soft. There is no tenderness.  Musculoskeletal: She exhibits no edema.  Neurological: She is alert and oriented to person, place, and time.  Normal strength and sensation in bilateral upper and lower extremities. Normal coordination, normal gait.    Skin: Skin is warm and dry.  Psychiatric: She has a normal mood and affect.  Nursing note and vitals reviewed.    ED Treatments / Results  Labs (all labs ordered are listed, but only abnormal results are displayed) Labs Reviewed  POC URINE PREG, ED    EKG  EKG Interpretation None       Radiology Ct Head Wo Contrast  Result Date: 09/12/2015 CLINICAL DATA:  Initial evaluation for acute trauma, knocked out, woke up  in pool blood coming from nose. EXAM: CT HEAD WITHOUT CONTRAST CT MAXILLOFACIAL WITHOUT CONTRAST CT CERVICAL SPINE WITHOUT CONTRAST TECHNIQUE: Multidetector CT imaging of the head, cervical spine, and maxillofacial structures were performed using the standard protocol without intravenous contrast. Multiplanar CT image reconstructions of the cervical spine and maxillofacial structures were also generated. COMPARISON:  None. FINDINGS: CT HEAD FINDINGS Cerebral volume within normal limits for patient age. No acute intracranial hemorrhage or infarct. No mass lesion, midline shift, or mass effect. No extra-axial fluid collection. Scalp soft tissues within normal limits. Mastoid air cells are clear. Calvarium intact. CT MAXILLOFACIAL FINDINGS Mild soft tissue swelling about the nose, greater on the left. No other appreciable soft tissue swelling within the face. Globes intact and normal in appearance. No  retro-orbital hematoma or other pathology. Bony orbits intact without evidence orbital floor fracture. Zygomatic arches intact. No acute maxillary fracture. Pterygoid plates intact. There is a subtle acute nondisplaced fracture of the right nasal bone (series 11, image 7). Left nasal bone grossly intact. Nasal septum intact. Mandible intact. No acute abnormality about the dentition. Mandibular condyles normally situated within the temporomandibular fossa. Mild mucosal thickening and opacity within the frontoethmoidal recesses bilaterally. Paranasal sinuses are otherwise clear. CT CERVICAL SPINE FINDINGS Straightening with slight reversal of the normal cervical lordosis, suspected to be related to patient positioning. Vertebral body heights are preserved. Normal C1-2 articulations are intact. No prevertebral soft tissue swelling. No acute fracture or listhesis. Visualized soft tissues of the neck are within normal limits. Visualized lung apices are clear without evidence of apical pneumothorax. IMPRESSION: CT HEAD: Normal head CT.  No acute intracranial process identified. CT MAXILLOFACIAL: 1. Acute nondisplaced fracture of the right nasal bone. 2. No other acute maxillofacial injury identified. CT CERVICAL SPINE: No acute traumatic injury within cervical spine. Electronically Signed   By: Rise Mu M.D.   On: 09/12/2015 13:34   Ct Cervical Spine Wo Contrast  Result Date: 09/12/2015 CLINICAL DATA:  Initial evaluation for acute trauma, knocked out, woke up in pool blood coming from nose. EXAM: CT HEAD WITHOUT CONTRAST CT MAXILLOFACIAL WITHOUT CONTRAST CT CERVICAL SPINE WITHOUT CONTRAST TECHNIQUE: Multidetector CT imaging of the head, cervical spine, and maxillofacial structures were performed using the standard protocol without intravenous contrast. Multiplanar CT image reconstructions of the cervical spine and maxillofacial structures were also generated. COMPARISON:  None. FINDINGS: CT HEAD FINDINGS  Cerebral volume within normal limits for patient age. No acute intracranial hemorrhage or infarct. No mass lesion, midline shift, or mass effect. No extra-axial fluid collection. Scalp soft tissues within normal limits. Mastoid air cells are clear. Calvarium intact. CT MAXILLOFACIAL FINDINGS Mild soft tissue swelling about the nose, greater on the left. No other appreciable soft tissue swelling within the face. Globes intact and normal in appearance. No retro-orbital hematoma or other pathology. Bony orbits intact without evidence orbital floor fracture. Zygomatic arches intact. No acute maxillary fracture. Pterygoid plates intact. There is a subtle acute nondisplaced fracture of the right nasal bone (series 11, image 7). Left nasal bone grossly intact. Nasal septum intact. Mandible intact. No acute abnormality about the dentition. Mandibular condyles normally situated within the temporomandibular fossa. Mild mucosal thickening and opacity within the frontoethmoidal recesses bilaterally. Paranasal sinuses are otherwise clear. CT CERVICAL SPINE FINDINGS Straightening with slight reversal of the normal cervical lordosis, suspected to be related to patient positioning. Vertebral body heights are preserved. Normal C1-2 articulations are intact. No prevertebral soft tissue swelling. No acute fracture or listhesis. Visualized soft tissues  of the neck are within normal limits. Visualized lung apices are clear without evidence of apical pneumothorax. IMPRESSION: CT HEAD: Normal head CT.  No acute intracranial process identified. CT MAXILLOFACIAL: 1. Acute nondisplaced fracture of the right nasal bone. 2. No other acute maxillofacial injury identified. CT CERVICAL SPINE: No acute traumatic injury within cervical spine. Electronically Signed   By: Rise Mu M.D.   On: 09/12/2015 13:34   Ct Maxillofacial Wo Contrast  Result Date: 09/12/2015 CLINICAL DATA:  Initial evaluation for acute trauma, knocked out, woke up  in pool blood coming from nose. EXAM: CT HEAD WITHOUT CONTRAST CT MAXILLOFACIAL WITHOUT CONTRAST CT CERVICAL SPINE WITHOUT CONTRAST TECHNIQUE: Multidetector CT imaging of the head, cervical spine, and maxillofacial structures were performed using the standard protocol without intravenous contrast. Multiplanar CT image reconstructions of the cervical spine and maxillofacial structures were also generated. COMPARISON:  None. FINDINGS: CT HEAD FINDINGS Cerebral volume within normal limits for patient age. No acute intracranial hemorrhage or infarct. No mass lesion, midline shift, or mass effect. No extra-axial fluid collection. Scalp soft tissues within normal limits. Mastoid air cells are clear. Calvarium intact. CT MAXILLOFACIAL FINDINGS Mild soft tissue swelling about the nose, greater on the left. No other appreciable soft tissue swelling within the face. Globes intact and normal in appearance. No retro-orbital hematoma or other pathology. Bony orbits intact without evidence orbital floor fracture. Zygomatic arches intact. No acute maxillary fracture. Pterygoid plates intact. There is a subtle acute nondisplaced fracture of the right nasal bone (series 11, image 7). Left nasal bone grossly intact. Nasal septum intact. Mandible intact. No acute abnormality about the dentition. Mandibular condyles normally situated within the temporomandibular fossa. Mild mucosal thickening and opacity within the frontoethmoidal recesses bilaterally. Paranasal sinuses are otherwise clear. CT CERVICAL SPINE FINDINGS Straightening with slight reversal of the normal cervical lordosis, suspected to be related to patient positioning. Vertebral body heights are preserved. Normal C1-2 articulations are intact. No prevertebral soft tissue swelling. No acute fracture or listhesis. Visualized soft tissues of the neck are within normal limits. Visualized lung apices are clear without evidence of apical pneumothorax. IMPRESSION: CT HEAD: Normal  head CT.  No acute intracranial process identified. CT MAXILLOFACIAL: 1. Acute nondisplaced fracture of the right nasal bone. 2. No other acute maxillofacial injury identified. CT CERVICAL SPINE: No acute traumatic injury within cervical spine. Electronically Signed   By: Rise Mu M.D.   On: 09/12/2015 13:34    Procedures Procedures (including critical care time)  Medications Ordered in ED Medications  acetaminophen (TYLENOL) tablet 1,000 mg (1,000 mg Oral Given 09/12/15 1129)     Initial Impression / Assessment and Plan / ED Course  I have reviewed the triage vital signs and the nursing notes.  Pertinent labs & imaging results that were available during my care of the patient were reviewed by me and considered in my medical decision making (see chart for details).  Clinical Course  Patient is a 47 old female who presents after head injury, LOC. This was run into by a fifth grader at work. She had nosebleed followed by brief period of LOC. Nose bleeding has since stopped the patient denies any current symptoms besides mild pain to her nasal bridge. She lost consciousness several minutes after being hit in the head likely secondary to vasovagal versus site of blood.  Vital signs stable. Patient alert and oriented without any neurologic deficits. She some bruising to her nasal bridge but no other signs of trauma. She has full  range of motion of her neck without any C, T, L-spine tenderness.  CT head, C-spine, face performed. Negative except for nondisplaced nasal bone fracture. C-collar cleared at bedside. Patient okay for discharge home with PCP follow-up. Encouraged take ibuprofen, Tylenol every 4-6 hours at home as needed for pain relief. Encouraged use ice to help with swelling.  Patient ambulatory in no acute distress at time of discharge.  I discussed case with my attending, Dr. Dalene Seltzer.    Final Clinical Impressions(s) / ED Diagnoses   Final diagnoses:  Head injury,  initial encounter  Concussion, with loss of consciousness of unspecified duration, initial encounter  Nasal bone fracture, closed, initial encounter    New Prescriptions New Prescriptions   ACETAMINOPHEN (TYLENOL) 500 MG TABLET    Take 1 tablet (500 mg total) by mouth every 6 (six) hours as needed.   IBUPROFEN (ADVIL,MOTRIN) 600 MG TABLET    Take 1 tablet (600 mg total) by mouth every 6 (six) hours as needed.     Dan Humphreys, MD 09/12/15 1551    Alvira Monday, MD 09/13/15 1240

## 2015-09-12 NOTE — ED Notes (Signed)
Pt made aware of procedure for observed urine test.  Pt refused.  States it's due "to the way she (the phlebotomist) said it".  Pt began yelling at RN and Phlebotomist.  Phlebotomist states that is an automatic refusal and pt made aware.  Pt continuing to yell at RN.  RN made Charge RN aware who is in room at this time.

## 2015-09-12 NOTE — ED Notes (Signed)
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) tomorrow morning to repeat test.  Made her aware that pt can also go to [facility] for testing tomorrow.  Pt made aware and states she would like to try one more time, and phlebotomist made aware

## 2015-09-12 NOTE — ED Notes (Signed)
Called phlebotomy to confirm pt in room and ready for UDS collection

## 2015-09-12 NOTE — ED Notes (Signed)
Pt aware that her place of employment is requesting worker's compensation, pt aware of process for this, pt reports that she is not able to urinate, phlebotomy at bedside discussing process to obtain urine sample for worker's comp

## 2015-09-12 NOTE — ED Triage Notes (Signed)
Pt in c/o syncopal episode post colliding with a student, pt states, "A student got scared of a bee & ran into me. I then fell backwards hitting my head on concrete. I got up & went to the bathroom & cleaned up because my nose was bleeding & that is when I passed out." unknown duration of sycnope, nose swollen upon arrival to ED, A&O x4, ambulatory

## 2015-09-12 NOTE — ED Notes (Signed)
Pt attempted sample.  Sample did not read appropriate temperature.  Unable to accept.  Pt made aware and continuing to drink water

## 2016-04-29 ENCOUNTER — Encounter (HOSPITAL_COMMUNITY): Payer: Self-pay

## 2016-04-29 ENCOUNTER — Emergency Department (HOSPITAL_COMMUNITY)
Admission: EM | Admit: 2016-04-29 | Discharge: 2016-04-29 | Disposition: A | Discharge status: Home / Self Care | Payer: Managed Care, Other (non HMO) | Attending: Emergency Medicine | Admitting: Emergency Medicine

## 2016-04-29 ENCOUNTER — Emergency Department (HOSPITAL_COMMUNITY): Payer: Managed Care, Other (non HMO)

## 2016-04-29 DIAGNOSIS — N200 Calculus of kidney: Secondary | ICD-10-CM

## 2016-04-29 DIAGNOSIS — J45909 Unspecified asthma, uncomplicated: Secondary | ICD-10-CM | POA: Insufficient documentation

## 2016-04-29 DIAGNOSIS — R1032 Left lower quadrant pain: Secondary | ICD-10-CM | POA: Diagnosis present

## 2016-04-29 LAB — LIPASE, BLOOD: LIPASE: 16 U/L (ref 11–51)

## 2016-04-29 LAB — COMPREHENSIVE METABOLIC PANEL
ALBUMIN: 4 g/dL (ref 3.5–5.0)
ALK PHOS: 50 U/L (ref 38–126)
ALT: 14 U/L (ref 14–54)
AST: 16 U/L (ref 15–41)
Anion gap: 4 — ABNORMAL LOW (ref 5–15)
BUN: 12 mg/dL (ref 6–20)
CHLORIDE: 111 mmol/L (ref 101–111)
CO2: 25 mmol/L (ref 22–32)
CREATININE: 0.69 mg/dL (ref 0.44–1.00)
Calcium: 9.1 mg/dL (ref 8.9–10.3)
GFR calc non Af Amer: >60 mL/min (ref >60)
GLUCOSE: 95 mg/dL (ref 65–99)
Potassium: 4.1 mmol/L (ref 3.5–5.1)
SODIUM: 140 mmol/L (ref 135–145)
Total Bilirubin: 0.9 mg/dL (ref 0.3–1.2)
Total Protein: 6.5 g/dL (ref 6.5–8.1)

## 2016-04-29 LAB — URINALYSIS, ROUTINE W REFLEX MICROSCOPIC
Bilirubin Urine: NEGATIVE
Glucose, UA: NEGATIVE mg/dL
Ketones, ur: NEGATIVE mg/dL
Leukocytes, UA: NEGATIVE
Nitrite: NEGATIVE
PROTEIN: 30 mg/dL — AB
Specific Gravity, Urine: 1.014 (ref 1.005–1.030)
pH: 6 (ref 5.0–8.0)

## 2016-04-29 LAB — CBC
HCT: 36 % (ref 36.0–46.0)
Hemoglobin: 12.2 g/dL (ref 12.0–15.0)
MCH: 30.5 pg (ref 26.0–34.0)
MCHC: 33.9 g/dL (ref 30.0–36.0)
MCV: 90 fL (ref 78.0–100.0)
PLATELETS: 194 10*3/uL (ref 150–400)
RBC: 4 MIL/uL (ref 3.87–5.11)
RDW: 13.4 % (ref 11.5–15.5)
WBC: 9.4 10*3/uL (ref 4.0–10.5)

## 2016-04-29 LAB — I-STAT BETA HCG BLOOD, ED (MC, WL, AP ONLY)

## 2016-04-29 MED ORDER — HYDROCODONE-ACETAMINOPHEN 5-325 MG PO TABS: 1.0000 | ORAL_TABLET | Freq: Four times a day (QID) | ORAL | 0 refills | Status: DC | PRN

## 2016-04-29 MED ORDER — KETOROLAC TROMETHAMINE 30 MG/ML IJ SOLN
30.0000 mg | Freq: Once | INTRAMUSCULAR | Status: AC
  Administered 2016-04-29: 30 mg via INTRAVENOUS
  Filled 2016-04-29: qty 1

## 2016-04-29 MED ORDER — SODIUM CHLORIDE 0.9 % IV BOLUS (SEPSIS)
1000.0000 mL | Freq: Once | INTRAVENOUS | Status: AC
  Administered 2016-04-29: 1000 mL via INTRAVENOUS

## 2016-04-29 MED ORDER — ONDANSETRON HCL 4 MG PO TABS: 4.0000 mg | ORAL_TABLET | Freq: Three times a day (TID) | ORAL | 0 refills | Status: DC | PRN

## 2016-04-29 NOTE — ED Notes (Signed)
Pt reports understanding of discharge information. No questions at time of discharge 

## 2016-04-29 NOTE — Discharge Instructions (Signed)
Drink plenty of fluids to keep your urine clear. Use the filter to catch the stone when you urinate. Follow up with urology. Use ibuprofen for pain and vicodin for breakthrough pain. Return to the emergency department if you experience worsening pain, fever, chills, nausea, vomiting or any new concerning symptoms.

## 2016-04-29 NOTE — ED Triage Notes (Addendum)
Pt c/o intermittent lower abdominal pain x 3 days and lower back pain, dysuria, increased urinary frequency, and n/v starting today.  Pain score 5/10.  Pt has not taken anything for symptoms.  Pt reports Hx of recurrent UTIs.  Sts UTIs were managed by an Urgent Care.

## 2016-04-29 NOTE — ED Provider Notes (Signed)
[facility]-EMERGENCY DEPT Provider Note   CSN: [id number] Arrival date & time: 04/29/16  1402     History   Chief Complaint Chief Complaint  Patient presents with  . Abdominal Pain  . Back Pain    HPI [name] is a 26 y.o. female with a history of recurrent UTIs presenting with 3 days of left lower quadrant suprapubic pain. Today she states that her pain goes to the back on the right side and she felt a sharp pain like glass in her suprapubic area on urination. She has experienced some pain earlier that was not relieved by any positioning. She is experienced some mild relief with bending forward and applying pressure in her abdomen. She has tried ibuprofen without much relief. She has been having nausea and vomiting from the pain. She has not been able to eat very much today. Also reports night sweats. She denies fever, chills, hematuria, vaginal discharge or history of kidney stones. HPI  Past Medical History:  Diagnosis Date  . Asthma    "sports induced"  . Pericarditis 2011   post flu    Patient Active Problem List   Diagnosis Date Noted  . Small bowel obstruction 05/28/2014  . Abdominal pain 05/28/2014  . Asthma 05/28/2014  . Nausea vomiting and diarrhea 05/28/2014  . Postop check 12/16/2010    Past Surgical History:  Procedure Laterality Date  . CARDIAC CATHETERIZATION  Oct. 2013  . CHOLECYSTECTOMY  11/2010  . KNEE ARTHROSCOPY  11/2006   left  . LAPAROSCOPY N/A 11/23/2014   Procedure: LAPAROSCOPY DIAGNOSTIC possible removal of endometriosis;  Surgeon: Mitchel Honour, DO;  Location: [facility];  Service: Gynecology;  Laterality: N/A;  . SHOULDER SURGERY  06/2010   right    OB History    No data available       Home Medications    Prior to Admission medications   Medication Sig Start Date End Date Taking? Authorizing Provider  acetaminophen (TYLENOL) 500 MG tablet Take 1 tablet (500 mg total) by mouth every 6 (six) hours as needed. 09/12/15  Yes Dan Humphreys,  MD  acetaminophen (TYLENOL) 500 MG tablet Take 1,000 mg by mouth every 6 (six) hours as needed for moderate pain.   Yes Historical Provider, MD  albuterol (PROVENTIL HFA;VENTOLIN HFA) 108 (90 BASE) MCG/ACT inhaler Inhale 2 puffs into the lungs every 6 (six) hours as needed for wheezing or shortness of breath.    Historical Provider, MD  amphetamine-dextroamphetamine (ADDERALL) 20 MG tablet Take 10 mg by mouth daily.  09/14/13   Historical Provider, MD  chlorpheniramine-HYDROcodone (TUSSIONEX PENNKINETIC ER) 10-8 MG/5ML SUER Take 5 mLs by mouth every 12 (twelve) hours as needed for cough. Patient not taking: Reported on 04/29/2016 03/29/15   Hayden Rasmussen, NP  clonazePAM (KLONOPIN) 0.5 MG tablet Take 0.5 mg by mouth daily as needed for anxiety.  11/07/13   Historical Provider, MD  FLUoxetine (PROZAC) 20 MG tablet Take 20 mg by mouth daily.    Historical Provider, MD  fluticasone (FLONASE) 50 MCG/ACT nasal spray Place 2 sprays into both nostrils daily as needed for allergies or rhinitis.    Historical Provider, MD  HYDROcodone-acetaminophen (NORCO/VICODIN) 5-325 MG tablet Take 1 tablet by mouth every 6 (six) hours as needed for severe pain. 04/29/16   Georgiana Shore, PA-C  ibuprofen (ADVIL,MOTRIN) 600 MG tablet Take 1 tablet (600 mg total) by mouth every 6 (six) hours as needed. Patient not taking: Reported on 04/29/2016 09/12/15   Dan Humphreys, MD  ondansetron (ZOFRAN) 4 MG tablet Take 1 tablet (4 mg total) by mouth every 8 (eight) hours as needed for nausea or vomiting. 04/29/16   Georgiana ShoreJessica B Yasmin Bronaugh, PA-C  oseltamivir (TAMIFLU) 75 MG capsule Take 1 capsule (75 mg total) by mouth 2 (two) times daily. X 5 days Patient not taking: Reported on 04/29/2016 03/29/15   Hayden Rasmussenavid Mabe, NP  SUMAtriptan (IMITREX) 100 MG tablet Take 100 mg by mouth every 2 (two) hours as needed for migraine or headache. May repeat in 2 hours if headache persists or recurs.    Historical Provider, MD    Family History Family History    Problem Relation Age of Onset  . Heart disease Maternal Grandfather     heart attack  . Cancer Paternal Grandfather     prostate    Social History Social History  Substance Use Topics  . Smoking status: Never Smoker  . Smokeless tobacco: Never Used  . Alcohol use No     Allergies   Benadryl [diphenhydramine hcl]   Review of Systems Review of Systems  Constitutional: Negative for chills and fever.  HENT: Negative for ear pain and sore throat.   Eyes: Negative for pain and visual disturbance.  Respiratory: Negative for cough, chest tightness, shortness of breath, wheezing and stridor.   Cardiovascular: Negative for chest pain, palpitations and leg swelling.  Gastrointestinal: Positive for abdominal pain, diarrhea, nausea and vomiting. Negative for abdominal distention and blood in stool.  Genitourinary: Positive for dysuria, flank pain and frequency. Negative for hematuria and menstrual problem.  Musculoskeletal: Negative for arthralgias, back pain, myalgias, neck pain and neck stiffness.  Skin: Negative for color change, pallor and rash.  Neurological: Negative for seizures and syncope.     Physical Exam Updated Vital Signs BP 124/84 (BP Location: Right Arm)   Pulse 65   Temp 98.4 F (36.9 C) (Oral)   Resp 15   Ht 5\' 3"  (1.6 m)   Wt 59 kg   LMP 04/27/2016 Comment: HCG neg 04/29/16  SpO2 100%   BMI 23.03 kg/m   Physical Exam  Constitutional: She appears well-developed and well-nourished. No distress.  Afebrile, nontoxic-appearing, lying in bed in apparent discomfort.  HENT:  Head: Normocephalic and atraumatic.  Eyes: Conjunctivae and EOM are normal.  Neck: Neck supple.  Cardiovascular: Normal rate, regular rhythm and normal heart sounds.   No murmur heard. Pulmonary/Chest: Effort normal and breath sounds normal. No respiratory distress. She has no wheezes. She has no rales. She exhibits no tenderness.  Abdominal: Soft. Bowel sounds are normal. She exhibits no  distension and no mass. There is tenderness. There is no rebound and no guarding.  Right-sided CVA tenderness and mild discomfort on palpation of suprapubic area  Musculoskeletal: She exhibits no edema.  Neurological: She is alert.  Skin: Skin is warm and dry. She is not diaphoretic.  Psychiatric: She has a normal mood and affect.  Nursing note and vitals reviewed.    ED Treatments / Results  Labs (all labs ordered are listed, but only abnormal results are displayed) Labs Reviewed  COMPREHENSIVE METABOLIC PANEL - Abnormal; Notable for the following:       Result Value   Anion gap 4 (*)    All other components within normal limits  URINALYSIS, ROUTINE W REFLEX MICROSCOPIC - Abnormal; Notable for the following:    APPearance HAZY (*)    Hgb urine dipstick LARGE (*)    Protein, ur 30 (*)    Bacteria, UA FEW (*)  Squamous Epithelial / LPF 0-5 (*)    All other components within normal limits  LIPASE, BLOOD  CBC  I-STAT BETA HCG BLOOD, ED ([facility], [facility], [facility] ONLY)    EKG  EKG Interpretation None       Radiology Ct Renal Stone Study  Result Date: 04/29/2016 CLINICAL DATA:  Pt c/o intermittent lower abdominal pain x 3 days and lower back pain, dysuria, increased urinary frequency, and n/v starting today. Pain score 5/10. Pt has not taken anything for symptoms. Pt reports Hx of recurrent UTIs. EXAM: CT ABDOMEN AND PELVIS WITHOUT CONTRAST TECHNIQUE: Multidetector CT imaging of the abdomen and pelvis was performed following the standard protocol without IV contrast. COMPARISON:  CT abdomen dated 05/28/2014. FINDINGS: Lower chest: No acute abnormality. Hepatobiliary: Status post cholecystectomy. No focal liver abnormality is seen. No appreciable bile duct dilatation. Pancreas: Unremarkable. No pancreatic ductal dilatation or surrounding inflammatory changes. Spleen: Normal in size without focal abnormality. Adrenals/Urinary Tract: 4 x 2 mm stone at the right ureterovesical junction causing mild  hydroureter, without hydronephrosis. At least 6 right renal stones, measuring 1 mm to 5 mm in size. At least 5 left renal stones, measuring 1 mm to 6 mm in size. No left-sided ureteral stone. Bladder is partially decompressed. Stomach/Bowel: Bowel is normal in caliber. No bowel wall thickening or evidence of bowel wall inflammation seen. Appendix is normal. Vascular/Lymphatic: No significant vascular findings are present. No enlarged abdominal or pelvic lymph nodes. Reproductive: Unremarkable. Other: No free fluid or abscess collections seen. No free intraperitoneal air. Musculoskeletal: No acute or suspicious osseous finding. Superficial soft tissues are unremarkable. IMPRESSION: 1. 4 x 2 mm stone at the right UVJ causing mild right-sided hydroureter but no hydronephrosis. 2. Bilateral nephrolithiasis. Electronically Signed   By: Bary Richard M.D.   On: 04/29/2016 17:44    Procedures Procedures (including critical care time)  Medications Ordered in ED Medications  sodium chloride 0.9 % bolus 1,000 mL (0 mLs Intravenous Stopped 04/29/16 1908)  ketorolac (TORADOL) 30 MG/ML injection 30 mg (30 mg Intravenous Given 04/29/16 1819)     Initial Impression / Assessment and Plan / ED Course  I have reviewed the triage vital signs and the nursing notes.  Pertinent labs & imaging results that were available during my care of the patient were reviewed by me and considered in my medical decision making (see chart for details).     Patient presents with right-sided flank pain. CT renal showing four 2 mm stones at UVJ. Patient's pain and nausea were managed while in ED. Given IV fluids and toradol with significant improvement.  Discharge home with symptomatic relief and close follow-up with urology. Patient was discussed with Dr. Jacqulyn Bath who agrees with assessment and plan. Discussed strict return precautions and advised to return to the emergency department if experiencing any new or worsening symptoms.  Instructions were understood and patient agreed with discharge plan.  Final Clinical Impressions(s) / ED Diagnoses   Final diagnoses:  Bilateral nephrolithiasis    New Prescriptions New Prescriptions   HYDROCODONE-ACETAMINOPHEN (NORCO/VICODIN) 5-325 MG TABLET    Take 1 tablet by mouth every 6 (six) hours as needed for severe pain.   ONDANSETRON (ZOFRAN) 4 MG TABLET    Take 1 tablet (4 mg total) by mouth every 8 (eight) hours as needed for nausea or vomiting.     Georgiana Shore, PA-C 04/29/16 2018    Maia Plan, MD 04/30/16 1901

## 2016-11-11 ENCOUNTER — Telehealth: Payer: Self-pay | Admitting: Neurology

## 2016-11-11 ENCOUNTER — Encounter: Payer: Self-pay | Admitting: Neurology

## 2016-11-11 ENCOUNTER — Ambulatory Visit (INDEPENDENT_AMBULATORY_CARE_PROVIDER_SITE_OTHER): Payer: Managed Care, Other (non HMO) | Admitting: Neurology

## 2016-11-11 DIAGNOSIS — R569 Unspecified convulsions: Secondary | ICD-10-CM | POA: Diagnosis not present

## 2016-11-11 MED ORDER — LAMOTRIGINE 25 MG PO TABS: 25.0000 mg | ORAL_TABLET | Freq: Every day | ORAL | 0 refills | Status: DC

## 2016-11-11 MED ORDER — LAMOTRIGINE 100 MG PO TABS: 100.0000 mg | ORAL_TABLET | Freq: Every day | ORAL | 11 refills | Status: DC

## 2016-11-11 NOTE — Telephone Encounter (Signed)
I spoke to [place] at [facility] they are going to contact the patient to schedule her MRI.

## 2016-11-11 NOTE — Progress Notes (Signed)
PATIENT: [name] DOB: 1990/05/28  Chief Complaint  Patient presents with  . Seizures    She is here with her mother, [name], and girlfriend, [place]. She is here for evaluation of seizures. Her last reported seizure is August 21, 2016. She reports collapsing and convulsing and also reported feeling very hot prior to the seizure. Her first reported seizure was when she was toddler. She had seizures again in high school which she felt was possibly related to pain. Previous EEGs in high school were normal. She only been on Klonopin.  Marland Kitchen PCP    Paschal Dopp, PA     HISTORICAL  [name] is a 26 years old female, accompanied by her mother [name] and girlfriend Moishe Spice, seen in refer by her primary care PA Ladonna Snide for evaluation of seizure, initial evaluation was November 11 2016  I have reviewed and summarized referring note, she was born full-term, began to have seizure as a toddler, no warning signs, sudden onset of body jerking movement, never was treated with antiepileptic medications  Had few seizures in her high school, 3 seizure since 2017, most recent one was on August 21 2016, she was at outdoor activities, was very hot, compact, she felt dizzy, lightheaded, then fell to the ground, had tonic-clonic seizing for few minutes, she bite her lips, post event confusion.  One seizure in October 2017, after she bumped her hand in a sharp edge on the desk, she had severe sharp pain in her hand, followed by sudden loss of consciousness.  One seizure few months before that, she had nose fracture by a child accidentally bump his head into her nose, with nosebleeding, then fell to the ground had tonic-clonic seizure for few minutes,  She works as a Interior and spatial designer for [employer]  REVIEW OF SYSTEMS: Full 14 system review of systems performed and notable only for as above.  ALLERGIES: Allergies  Allergen Reactions  . Benadryl [Diphenhydramine Hcl] Other (See  Comments)    Liquid capsules only. Aggressive behavior     HOME MEDICATIONS: Current Outpatient Prescriptions  Medication Sig Dispense Refill  . acetaminophen (TYLENOL) 500 MG tablet Take 1 tablet (500 mg total) by mouth every 6 (six) hours as needed. 30 tablet 0  . albuterol (PROVENTIL HFA;VENTOLIN HFA) 108 (90 BASE) MCG/ACT inhaler Inhale 2 puffs into the lungs every 6 (six) hours as needed for wheezing or shortness of breath.    . amphetamine-dextroamphetamine (ADDERALL) 20 MG tablet Take 10 mg by mouth as needed.     . clonazePAM (KLONOPIN) 0.5 MG tablet Take 0.5 mg by mouth daily as needed for anxiety.     . fluticasone (FLONASE) 50 MCG/ACT nasal spray Place 2 sprays into both nostrils daily as needed for allergies or rhinitis.    Marland Kitchen HYDROcodone-acetaminophen (NORCO/VICODIN) 5-325 MG tablet Take 1 tablet by mouth every 6 (six) hours as needed for severe pain. 10 tablet 0  . ibuprofen (ADVIL,MOTRIN) 600 MG tablet Take 1 tablet (600 mg total) by mouth every 6 (six) hours as needed. 30 tablet 0  . ondansetron (ZOFRAN) 4 MG tablet Take 1 tablet (4 mg total) by mouth every 8 (eight) hours as needed for nausea or vomiting. 12 tablet 0  . SUMAtriptan (IMITREX) 100 MG tablet Take 100 mg by mouth every 2 (two) hours as needed for migraine or headache. May repeat in 2 hours if headache persists or recurs.     No current facility-administered medications for this visit.  PAST MEDICAL HISTORY: Past Medical History:  Diagnosis Date  . ADD (attention deficit disorder)   . Asthma    "sports induced"  . IBS (irritable bowel syndrome)   . Migraine   . Pericarditis 2011   post flu  . Seizure-like activity (HCC)     PAST SURGICAL HISTORY: Past Surgical History:  Procedure Laterality Date  . CARDIAC CATHETERIZATION  Oct. 2013  . CHOLECYSTECTOMY  11/2010  . KNEE ARTHROSCOPY  11/2006   left  . LAPAROSCOPY N/A 11/23/2014   Procedure: LAPAROSCOPY DIAGNOSTIC possible removal of endometriosis;   Surgeon: Mitchel Honour, DO;  Location: [facility];  Service: Gynecology;  Laterality: N/A;  . SHOULDER SURGERY  06/2010   right    FAMILY HISTORY: Family History  Problem Relation Age of Onset  . Asthma Mother   . Hypertension Father   . Cancer - Prostate Father   . Heart disease Maternal Grandfather        heart attack  . Cancer Paternal Grandfather        lung  . Hypertension Maternal Grandmother   . COPD Maternal Grandmother   . Heart failure Maternal Grandmother     SOCIAL HISTORY:  Social History   Social History  . Marital status: Single    Spouse name: N/A  . Number of children: 0  . Years of education: college   Occupational History  . Director      At [employer]   Social History Main Topics  . Smoking status: Never Smoker  . Smokeless tobacco: Never Used  . Alcohol use No  . Drug use: No     Comment: topical   . Sexual activity: Yes   Other Topics Concern  . Not on file   Social History Narrative   Lives at home with girlfriend   Right handed   Occasionally use of caffeine      PHYSICAL EXAM   Vitals:   11/11/16 1008  BP: 111/78  Pulse: 69  Weight: 139 lb 8 oz (63.3 kg)    Not recorded      Body mass index is 24.71 kg/m.  PHYSICAL EXAMNIATION:  Gen: NAD, conversant, well nourised, obese, well groomed                     Cardiovascular: Regular rate rhythm, no peripheral edema, warm, nontender. Eyes: Conjunctivae clear without exudates or hemorrhage Neck: Supple, no carotid bruits. Pulmonary: Clear to auscultation bilaterally   NEUROLOGICAL EXAM:  MENTAL STATUS: Speech:    Speech is normal; fluent and spontaneous with normal comprehension.  Cognition:     Orientation to time, place and person     Normal recent and remote memory     Normal Attention span and concentration     Normal Language, naming, repeating,spontaneous speech     Fund of knowledge   CRANIAL NERVES: CN II: Visual fields are full to confrontation.  Fundoscopic exam is normal with sharp discs and no vascular changes. Pupils are round equal and briskly reactive to light. CN III, IV, VI: extraocular movement are normal. No ptosis. CN V: Facial sensation is intact to pinprick in all 3 divisions bilaterally. Corneal responses are intact.  CN VII: Face is symmetric with normal eye closure and smile. CN VIII: Hearing is normal to rubbing fingers CN IX, X: Palate elevates symmetrically. Phonation is normal. CN XI: Head turning and shoulder shrug are intact CN XII: Tongue is midline with normal movements and no atrophy.  MOTOR: There is no pronator drift of out-stretched arms. Muscle bulk and tone are normal. Muscle strength is normal.  REFLEXES: Reflexes are 2+ and symmetric at the biceps, triceps, knees, and ankles. Plantar responses are flexor.  SENSORY: Intact to light touch, pinprick, positional sensation and vibratory sensation are intact in fingers and toes.  COORDINATION: Rapid alternating movements and fine finger movements are intact. There is no dysmetria on finger-to-nose and heel-knee-shin.    GAIT/STANCE: Posture is normal. Gait is steady with normal steps, base, arm swing, and turning. Heel and toe walking are normal. Tandem gait is normal.  Romberg is absent.   DIAGNOSTIC DATA (LABS, IMAGING, TESTING) - I reviewed patient records, labs, notes, testing and imaging myself where available.   ASSESSMENT AND PLAN  LAURANN MCMORRIS is a 26 y.o. female   Epilepsy  MRi brain w/wo  EEG  Start lamotrigine  bid.  Lab evaluations.  No driving until seizure free for 6 months   Levert Feinstein, M.D. Ph.D.  [facility] [address] [place], [place] [address] Ph: [phone] Fax: [phone]  CC: Referring Provider

## 2016-11-11 NOTE — Telephone Encounter (Signed)
She will run out of insurance at the Oct 2018  Please schedule her MRI and EEG soon

## 2016-11-11 NOTE — Patient Instructions (Signed)
No driving until seizure-free for 6 months 

## 2016-11-12 LAB — VITAMIN B12: VITAMIN B 12: 432 pg/mL (ref 232–1245)

## 2016-11-12 LAB — TSH: TSH: 2.43 u[IU]/mL (ref 0.450–4.500)

## 2016-11-12 LAB — RPR: RPR: NONREACTIVE

## 2016-11-12 LAB — ANA W/REFLEX: Anti Nuclear Antibody(ANA): NEGATIVE

## 2016-11-18 ENCOUNTER — Ambulatory Visit (INDEPENDENT_AMBULATORY_CARE_PROVIDER_SITE_OTHER): Payer: Managed Care, Other (non HMO) | Admitting: Neurology

## 2016-11-18 DIAGNOSIS — R569 Unspecified convulsions: Secondary | ICD-10-CM

## 2016-11-25 NOTE — Procedures (Signed)
   HISTORY: 26 years old female, with history of seizure  TECHNIQUE:  16 channel EEG was performed based on standard 10-16 international system. One channel was dedicated to EKG, which has demonstrates normal sinus rhythm of 72 beats per minutes.  Upon awakening, the posterior background activity was well-developed, in alpha range, 10 Hz, reactive to eye opening and closure.  There was no evidence of epileptiform discharge.  Photic stimulation was performed, which induced a symmetric photic driving.  Hyperventilation was performed, there was no abnormality elicit.  Stage II sleep was achieved.  CONCLUSION: This is a  normal awake and sleep EEG.  There is no electrodiagnostic evidence of epileptiform discharge.  Levert FeinsteinYijun Nadja Lina, M.D. Ph.D.  [facility] [address] [place], [place] [address] Phone: [phone] Fax:      [phone]

## 2017-05-10 ENCOUNTER — Telehealth: Payer: Self-pay | Admitting: *Deleted

## 2017-05-10 ENCOUNTER — Encounter: Payer: Self-pay | Admitting: Neurology

## 2017-05-10 NOTE — Telephone Encounter (Signed)
Patient is aware that her EEG is normal.  I told her this does not eliminate a diagnosis of seizures.  States has decided not to take the medication because of the potential side effects.  She has not scheduled her MRI yet.  Additionally, says she has been driving because it has been three month since her episode.  I informed her that [place] law is no driving until six months event free.  She verbalized understanding of this law.   She has decided not to follow up here but will discuss getting another opinion with her PCP.

## 2017-05-10 NOTE — Telephone Encounter (Signed)
Patient had a f/u appt with Dr. Terrace ArabiaYan this Wed. That she had to cancel but would like a call with the EEG results.

## 2017-05-10 NOTE — Telephone Encounter (Signed)
Please discharge patient from out clinic for noncompliance

## 2017-05-12 ENCOUNTER — Ambulatory Visit: Payer: Managed Care, Other (non HMO) | Admitting: Neurology

## 2017-07-01 ENCOUNTER — Emergency Department (HOSPITAL_COMMUNITY): Payer: BLUE CROSS/BLUE SHIELD

## 2017-07-01 ENCOUNTER — Emergency Department (HOSPITAL_COMMUNITY)
Admission: EM | Admit: 2017-07-01 | Discharge: 2017-07-01 | Disposition: A | Discharge status: Home / Self Care | Payer: BLUE CROSS/BLUE SHIELD | Attending: Emergency Medicine | Admitting: Emergency Medicine

## 2017-07-01 ENCOUNTER — Other Ambulatory Visit: Payer: Self-pay

## 2017-07-01 ENCOUNTER — Encounter (HOSPITAL_COMMUNITY): Payer: Self-pay

## 2017-07-01 DIAGNOSIS — J3489 Other specified disorders of nose and nasal sinuses: Secondary | ICD-10-CM

## 2017-07-01 DIAGNOSIS — J45909 Unspecified asthma, uncomplicated: Secondary | ICD-10-CM | POA: Diagnosis not present

## 2017-07-01 DIAGNOSIS — Z79899 Other long term (current) drug therapy: Secondary | ICD-10-CM | POA: Insufficient documentation

## 2017-07-01 DIAGNOSIS — R040 Epistaxis: Secondary | ICD-10-CM | POA: Diagnosis present

## 2017-07-01 MED ORDER — IBUPROFEN 400 MG PO TABS
600.0000 mg | ORAL_TABLET | Freq: Once | ORAL | Status: AC
  Administered 2017-07-01: 600 mg via ORAL
  Filled 2017-07-01: qty 1

## 2017-07-01 NOTE — ED Provider Notes (Signed)
[facility] EMERGENCY DEPARTMENT Provider Note   CSN: [id number] Arrival date & time: 07/01/17  1309     History   Chief Complaint Chief Complaint  Patient presents with  . Facial Pain    HPI [name] is a 27 y.o. female who presents with a nasal injury.  Past medical history significant for prior nasal fracture.  She states that her dog was jumping up on her bed and its head collided with her face just prior to arrival. There was a loud audible pop after the accident.  She reports pain and swelling over the bridge of her nose.  Her nose started bleeding from the right side which resolved on its own after coming to the ED.  He states the pain feels very similar as to when she broke her nose in the past. No headache, LOC  HPI  Past Medical History:  Diagnosis Date  . ADD (attention deficit disorder)   . Asthma    "sports induced"  . IBS (irritable bowel syndrome)   . Migraine   . Pericarditis 2011   post flu  . Seizure-like activity [facility])     Patient Active Problem List   Diagnosis Date Noted  . Seizures (HCC) 11/11/2016  . Small bowel obstruction (HCC) 05/28/2014  . Abdominal pain 05/28/2014  . Asthma 05/28/2014  . Nausea vomiting and diarrhea 05/28/2014  . Postop check 12/16/2010    Past Surgical History:  Procedure Laterality Date  . CARDIAC CATHETERIZATION  Oct. 2013  . CHOLECYSTECTOMY  11/2010  . KNEE ARTHROSCOPY  11/2006   left  . LAPAROSCOPY N/A 11/23/2014   Procedure: LAPAROSCOPY DIAGNOSTIC possible removal of endometriosis;  Surgeon: Mitchel Honour, DO;  Location: [facility];  Service: Gynecology;  Laterality: N/A;  . SHOULDER SURGERY  06/2010   right     OB History   None      Home Medications    Prior to Admission medications   Medication Sig Start Date End Date Taking? Authorizing Provider  acetaminophen (TYLENOL) 500 MG tablet Take 1 tablet (500 mg total) by mouth every 6 (six) hours as needed. 09/12/15   Dan Humphreys, MD   albuterol (PROVENTIL HFA;VENTOLIN HFA) 108 (90 BASE) MCG/ACT inhaler Inhale 2 puffs into the lungs every 6 (six) hours as needed for wheezing or shortness of breath.    [provider]  amphetamine-dextroamphetamine (ADDERALL) 20 MG tablet Take 10 mg by mouth as needed.  09/14/13   [provider]  clonazePAM (KLONOPIN) 0.5 MG tablet Take 0.5 mg by mouth daily as needed for anxiety.  11/07/13   [provider]  fluticasone (FLONASE) 50 MCG/ACT nasal spray Place 2 sprays into both nostrils daily as needed for allergies or rhinitis.    [provider]  HYDROcodone-acetaminophen (NORCO/VICODIN) 5-325 MG tablet Take 1 tablet by mouth every 6 (six) hours as needed for severe pain. 04/29/16   Georgiana Shore, PA-C  ibuprofen (ADVIL,MOTRIN) 600 MG tablet Take 1 tablet (600 mg total) by mouth every 6 (six) hours as needed. 09/12/15   Dan Humphreys, MD  lamoTRIgine (LAMICTAL) 100 MG tablet Take 1 tablet (100 mg total) by mouth daily. 11/11/16   Levert Feinstein, MD  lamoTRIgine (LAMICTAL) 25 MG tablet Take 1 tablet (25 mg total) by mouth daily. 1 tablet twice a day for the first week 2 tablets twice a day for the second week 3 tablets twice a day for the third week 4 tablets twice a day for the fourth  week  For total of 140 tablets  After finish titration with small dose of lamotrigine 25 mg, change to lamotrigine 100 mg twice a day 11/11/16   Levert Feinstein, MD  ondansetron (ZOFRAN) 4 MG tablet Take 1 tablet (4 mg total) by mouth every 8 (eight) hours as needed for nausea or vomiting. 04/29/16   Mathews Robinsons B, PA-C  SUMAtriptan (IMITREX) 100 MG tablet Take 100 mg by mouth every 2 (two) hours as needed for migraine or headache. May repeat in 2 hours if headache persists or recurs.    [provider]    Family History Family History  Problem Relation Age of Onset  . Asthma Mother   . Hypertension Father   . Cancer - Prostate Father   . Heart disease Maternal  Grandfather        heart attack  . Cancer Paternal Grandfather        lung  . Hypertension Maternal Grandmother   . COPD Maternal Grandmother   . Heart failure Maternal Grandmother     Social History Social History   Tobacco Use  . Smoking status: Never Smoker  . Smokeless tobacco: Never Used  Substance Use Topics  . Alcohol use: No    Alcohol/week: 1.2 oz    Types: 2 Cans of beer per week  . Drug use: No    Comment: topical      Allergies   Benadryl [diphenhydramine hcl]   Review of Systems Review of Systems  HENT: Positive for nosebleeds.        +nasal pain  Neurological: Negative for headaches.     Physical Exam Updated Vital Signs BP 126/85 (BP Location: Right Arm)   Pulse 74   Temp 98.3 F (36.8 C) (Oral)   Resp 18   LMP 06/06/2017   SpO2 100%   Physical Exam  Constitutional: She is oriented to person, place, and time. She appears well-developed and well-nourished. No distress.  HENT:  Head: Normocephalic and atraumatic.  Nose: No nasal septal hematoma.  Dried blood in the right nare. No active bleeding  Tenderness over the bridge of the nose. No septal tenderness  Eyes: Pupils are equal, round, and reactive to light. Conjunctivae are normal. Right eye exhibits no discharge. Left eye exhibits no discharge. No scleral icterus.  Neck: Normal range of motion.  Cardiovascular: Normal rate.  Pulmonary/Chest: Effort normal. No respiratory distress.  Abdominal: She exhibits no distension.  Neurological: She is alert and oriented to person, place, and time.  Skin: Skin is warm and dry.  Psychiatric: She has a normal mood and affect. Her behavior is normal.  Nursing note and vitals reviewed.    ED Treatments / Results  Labs (all labs ordered are listed, but only abnormal results are displayed) Labs Reviewed - No data to display  EKG None  Radiology Dg Nasal Bones  Result Date: 07/01/2017 CLINICAL DATA:  Nose injury. EXAM: NASAL BONES - 3+ VIEW  COMPARISON:  CT 09/12/2015. FINDINGS: No evidence of displaced fracture. Nasal bones and orbits appear to be intact. Maxillary spine intact. Paranasal sinuses are clear. Mastoids are clear. IMPRESSION: No acute abnormality. Electronically Signed   By: Maisie Fus  Register   On: 07/01/2017 14:20    Procedures Procedures (including critical care time)  Medications Ordered in ED Medications  ibuprofen (ADVIL,MOTRIN) tablet 600 mg (600 mg Oral Given 07/01/17 1443)     Initial Impression / Assessment and Plan / ED Course  I have reviewed the triage vital signs and  the nursing notes.  Pertinent labs & imaging results that were available during my care of the patient were reviewed by me and considered in my medical decision making (see chart for details).  27 year old female with concern for nasal fracture after her dog collided with her face and she heard a pop earlier today.  She had right-sided epistaxis which has resolved on its own.  Nasal bone x-rays are negative.  There is no septal hematoma.  Discussed results with the patient.  She is advised to follow-up with her doctor if pain persist.  Final Clinical Impressions(s) / ED Diagnoses   Final diagnoses:  Nasal pain  Right-sided epistaxis    ED Discharge Orders    None       Bethel Born, PA-C 07/01/17 1516    Pricilla Loveless, MD 07/01/17 [phone]

## 2017-07-01 NOTE — ED Triage Notes (Signed)
Head butted in nose by dog and felt pop. Pt states nose was bleeding pta but is now controlled

## 2018-07-21 ENCOUNTER — Other Ambulatory Visit: Payer: Self-pay | Admitting: *Deleted

## 2018-07-21 DIAGNOSIS — Z20822 Contact with and (suspected) exposure to covid-19: Secondary | ICD-10-CM

## 2018-07-24 LAB — NOVEL CORONAVIRUS, NAA: SARS-CoV-2, NAA: NOT DETECTED

## 2018-08-25 ENCOUNTER — Other Ambulatory Visit: Payer: Self-pay | Admitting: *Deleted

## 2018-08-25 DIAGNOSIS — Z20822 Contact with and (suspected) exposure to covid-19: Secondary | ICD-10-CM

## 2018-08-31 LAB — NOVEL CORONAVIRUS, NAA: SARS-CoV-2, NAA: NOT DETECTED

## 2018-10-21 ENCOUNTER — Other Ambulatory Visit: Payer: Self-pay | Admitting: *Deleted

## 2018-10-21 DIAGNOSIS — Z20822 Contact with and (suspected) exposure to covid-19: Secondary | ICD-10-CM

## 2018-10-22 LAB — NOVEL CORONAVIRUS, NAA: SARS-CoV-2, NAA: NOT DETECTED

## 2018-11-21 ENCOUNTER — Other Ambulatory Visit: Payer: Self-pay

## 2018-11-21 DIAGNOSIS — Z20822 Contact with and (suspected) exposure to covid-19: Secondary | ICD-10-CM

## 2018-11-22 LAB — NOVEL CORONAVIRUS, NAA: SARS-CoV-2, NAA: NOT DETECTED

## 2019-02-20 ENCOUNTER — Ambulatory Visit: Payer: BLUE CROSS/BLUE SHIELD | Attending: Internal Medicine

## 2019-02-20 DIAGNOSIS — Z20822 Contact with and (suspected) exposure to covid-19: Secondary | ICD-10-CM

## 2019-02-21 LAB — NOVEL CORONAVIRUS, NAA: SARS-CoV-2, NAA: NOT DETECTED

## 2019-03-27 ENCOUNTER — Ambulatory Visit: Payer: BLUE CROSS/BLUE SHIELD | Attending: Internal Medicine

## 2019-03-27 DIAGNOSIS — Z20822 Contact with and (suspected) exposure to covid-19: Secondary | ICD-10-CM

## 2019-03-28 LAB — NOVEL CORONAVIRUS, NAA: SARS-CoV-2, NAA: NOT DETECTED

## 2019-04-27 ENCOUNTER — Ambulatory Visit: Payer: BLUE CROSS/BLUE SHIELD | Attending: Family

## 2019-04-27 DIAGNOSIS — Z23 Encounter for immunization: Secondary | ICD-10-CM

## 2019-04-27 NOTE — Progress Notes (Signed)
   Covid-19 Vaccination Clinic  Name:  [name]    MRN: [id number] DOB: [birth year]  04/27/2019  Ms. Lykins was observed post Covid-19 immunization for 15 minutes without incident. She was provided with Vaccine Information Sheet and instruction to access the V-Safe system.   Ms. Vokes was instructed to call 911 with any severe reactions post vaccine: Marland Kitchen Difficulty breathing  . Swelling of face and throat  . A fast heartbeat  . A bad rash all over body  . Dizziness and weakness   Immunizations Administered    Name Date Dose VIS Date Route   Moderna COVID-19 Vaccine 04/27/2019 10:20 AM 0.5 mL 01/10/2019 Intramuscular   Manufacturer: Moderna   Lot: [id number]   NDC: 94446-190-12

## 2019-05-30 ENCOUNTER — Ambulatory Visit: Payer: BLUE CROSS/BLUE SHIELD | Attending: Family

## 2019-05-30 DIAGNOSIS — Z23 Encounter for immunization: Secondary | ICD-10-CM

## 2019-05-30 NOTE — Progress Notes (Signed)
   Covid-19 Vaccination Clinic  Name:  ROWYNN MCWEENEY    MRN: [id number] DOB: 1990-04-04  05/30/2019  Ms. Renk was observed post Covid-19 immunization for 15 minutes without incident. She was provided with Vaccine Information Sheet and instruction to access the V-Safe system.   Ms. Gilpin was instructed to call 911 with any severe reactions post vaccine: Marland Kitchen Difficulty breathing  . Swelling of face and throat  . A fast heartbeat  . A bad rash all over body  . Dizziness and weakness   Immunizations Administered    Name Date Dose VIS Date Route   Moderna COVID-19 Vaccine 05/30/2019 10:05 AM 0.5 mL 01/2019 Intramuscular   Manufacturer: Moderna   Lot: [id number]   NDC: 03474-259-56

## 2020-01-16 ENCOUNTER — Other Ambulatory Visit: Payer: BLUE CROSS/BLUE SHIELD

## 2020-01-18 ENCOUNTER — Other Ambulatory Visit: Payer: BLUE CROSS/BLUE SHIELD

## 2020-08-27 ENCOUNTER — Emergency Department (HOSPITAL_COMMUNITY): Payer: BLUE CROSS/BLUE SHIELD

## 2020-08-27 ENCOUNTER — Other Ambulatory Visit: Payer: Self-pay

## 2020-08-27 ENCOUNTER — Emergency Department (HOSPITAL_COMMUNITY)
Admission: EM | Admit: 2020-08-27 | Discharge: 2020-08-28 | Disposition: A | Discharge status: Home / Self Care | Payer: BLUE CROSS/BLUE SHIELD | Attending: Emergency Medicine | Admitting: Emergency Medicine

## 2020-08-27 ENCOUNTER — Encounter (HOSPITAL_COMMUNITY): Payer: Self-pay

## 2020-08-27 DIAGNOSIS — X501XXA Overexertion from prolonged static or awkward postures, initial encounter: Secondary | ICD-10-CM | POA: Insufficient documentation

## 2020-08-27 DIAGNOSIS — M25572 Pain in left ankle and joints of left foot: Secondary | ICD-10-CM | POA: Insufficient documentation

## 2020-08-27 DIAGNOSIS — J45909 Unspecified asthma, uncomplicated: Secondary | ICD-10-CM | POA: Insufficient documentation

## 2020-08-27 DIAGNOSIS — R202 Paresthesia of skin: Secondary | ICD-10-CM | POA: Insufficient documentation

## 2020-08-27 MED ORDER — METHOCARBAMOL 500 MG PO TABS: 500.0000 mg | ORAL_TABLET | Freq: Two times a day (BID) | ORAL | 0 refills | Status: AC

## 2020-08-27 MED ORDER — IBUPROFEN 800 MG PO TABS
800.0000 mg | ORAL_TABLET | Freq: Once | ORAL | Status: AC
  Administered 2020-08-27: 800 mg via ORAL
  Filled 2020-08-27: qty 1

## 2020-08-27 NOTE — ED Provider Notes (Signed)
[facility]-EMERGENCY DEPT Provider Note   CSN: [id number] Arrival date & time: 08/27/20  2042     History Chief Complaint  Patient presents with   Ankle Pain    [name] is a 30 y.o. female.  HPI Patient is a 30 year old female presented today with left ankle pain that began at approximately 8:00 PM when she stepped into a hole that she states was shallow and twisted her ankle weirdly.  She states she has no foot pain but does have some tingling sensations down there.  She is taken nothing for pain.  States that the pain is achy constant worse with touch and movement.  She states the outside of her ankle hurts the most.  She denies any bleeding abrasions.  Denies any numbness.  No other injuries.  She states that she sat down after the injury and laid down on the ground because she felt lightheaded from the pain.    Past Medical History:  Diagnosis Date   ADD (attention deficit disorder)    Asthma    "sports induced"   IBS (irritable bowel syndrome)    Migraine    Pericarditis 2011   post flu   Seizure-like activity [facility])     Patient Active Problem List   Diagnosis Date Noted   Seizures (HCC) 11/11/2016   Small bowel obstruction (HCC) 05/28/2014   Abdominal pain 05/28/2014   Asthma 05/28/2014   Nausea vomiting and diarrhea 05/28/2014   Postop check 12/16/2010    Past Surgical History:  Procedure Laterality Date   CARDIAC CATHETERIZATION  Oct. 2013   CHOLECYSTECTOMY  11/2010   KNEE ARTHROSCOPY  11/2006   left   LAPAROSCOPY N/A 11/23/2014   Procedure: LAPAROSCOPY DIAGNOSTIC possible removal of endometriosis;  Surgeon: Mitchel Honour, DO;  Location: [facility];  Service: Gynecology;  Laterality: N/A;   SHOULDER SURGERY  06/2010   right     OB History   No obstetric history on file.     Family History  Problem Relation Age of Onset   Asthma Mother    Hypertension Father    Cancer - Prostate Father    Heart disease Maternal  Grandfather        heart attack   Cancer Paternal Grandfather        lung   Hypertension Maternal Grandmother    COPD Maternal Grandmother    Heart failure Maternal Grandmother     Social History   Tobacco Use   Smoking status: Never   Smokeless tobacco: Never  Vaping Use   Vaping Use: Never used  Substance Use Topics   Alcohol use: No    Alcohol/week: 2.0 standard drinks    Types: 2 Cans of beer per week   Drug use: No    Comment: topical     Home Medications Prior to Admission medications   Medication Sig Start Date End Date Taking? Authorizing Provider  acetaminophen (TYLENOL) 500 MG tablet Take 1 tablet (500 mg total) by mouth every 6 (six) hours as needed. 09/12/15  Yes Dan Humphreys, MD  albuterol (PROVENTIL HFA;VENTOLIN HFA) 108 (90 BASE) MCG/ACT inhaler Inhale 2 puffs into the lungs every 6 (six) hours as needed for wheezing or shortness of breath.   Yes [provider]  methocarbamol (ROBAXIN) 500 MG tablet Take 1 tablet (500 mg total) by mouth 2 (two) times daily. 08/27/20  Yes Gailen Shelter, PA    Allergies    Benadryl [diphenhydramine hcl]  Review of Systems  Review of Systems  Constitutional:  Negative for fever.  HENT:  Negative for congestion.   Respiratory:  Negative for shortness of breath.   Cardiovascular:  Negative for chest pain.  Gastrointestinal:  Negative for abdominal distention.  Musculoskeletal:        Left ankle pain  Neurological:  Negative for dizziness and headaches.   Physical Exam Updated Vital Signs BP (!) 135/93 (BP Location: Right Arm)   Pulse 79   Resp 16   Ht 5\' 3"  (1.6 m)   Wt 70.3 kg   LMP 08/11/2020   SpO2 100%   BMI 27.46 kg/m   Physical Exam Vitals and nursing note reviewed.  Constitutional:      General: She is not in acute distress.    Appearance: Normal appearance. She is not ill-appearing.  HENT:     Head: Normocephalic and atraumatic.  Eyes:     General: No scleral icterus.       Right eye:  No discharge.        Left eye: No discharge.     Conjunctiva/sclera: Conjunctivae normal.  Pulmonary:     Effort: Pulmonary effort is normal.     Breath sounds: No stridor.  Musculoskeletal:     Comments: Significant tenderness to palpation of the left lateral malleolus.  No posterior or medial malleoli or tenderness to palpation.  Able to wiggle toes.  Sensation intact good cap refill in all toes.  No foot tib-fib or knee tenderness to palpation.  Neurological:     Mental Status: She is alert and oriented to person, place, and time. Mental status is at baseline.    ED Results / Procedures / Treatments   Labs (all labs ordered are listed, but only abnormal results are displayed) Labs Reviewed - No data to display  EKG None  Radiology DG Ankle Complete Left  Result Date: 08/27/2020 CLINICAL DATA:  Rolled the ankle EXAM: LEFT ANKLE COMPLETE - 3+ VIEW COMPARISON:  None. FINDINGS: There is no evidence of fracture, dislocation, or joint effusion. There is no evidence of arthropathy or other focal bone abnormality. Lateral soft tissue swelling. IMPRESSION: Lateral soft tissue swelling.  No acute osseous abnormality. Electronically Signed   By: 08/29/2020 M.D.   On: 08/27/2020 23:20    Procedures Procedures   Medications Ordered in ED Medications  ibuprofen (ADVIL) tablet 800 mg (800 mg Oral Given 08/27/20 2326)    ED Course  I have reviewed the triage vital signs and the nursing notes.  Pertinent labs & imaging results that were available during my care of the patient were reviewed by me and considered in my medical decision making (see chart for details).    MDM Rules/Calculators/A&P                           Patient with left ankle injury.  She is focal tenderness palpation of the lateral malleolus of the left ankle.  No other tenderness.  Has good distal pulses good cap refill.  Good sensation.  X-rays personally reviewed by myself negative for fracture.  Placed in ankle  brace and given crutches.  Ibuprofen given here for pain.  Tylenol ibuprofen recommendations given and muscle relaxer for additional pain should she need it.   Final Clinical Impression(s) / ED Diagnoses Final diagnoses:  Acute left ankle pain    Rx / DC Orders ED Discharge Orders          Ordered  methocarbamol (ROBAXIN) 500 MG tablet  2 times daily        08/27/20 2335             Gailen Shelter, [place] 08/27/20 2338    Lorre Nick, MD 08/28/20 1536

## 2020-08-27 NOTE — Discharge Instructions (Addendum)
It is difficult based on the examination today to tell whether you are experiencing an ankle sprain or a bad ankle contusion or bruise.  Please rest ice and elevate use the ankle brace I given you use crutches as needed for discomfort.  Please follow-up with [facility] or any other orthopedic provider that you are familiar with or established with.   Please use Tylenol or ibuprofen for pain.  You may use 600 mg ibuprofen every 6 hours or 1000 mg of Tylenol every 6 hours.  You may choose to alternate between the 2.  This would be most effective.  Not to exceed 4 g of Tylenol within 24 hours.  Not to exceed 3200 mg ibuprofen 24 hours.   I have also prescribed a muscle relaxer to use as needed for additional pain.

## 2020-08-27 NOTE — ED Notes (Signed)
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

## 2020-08-27 NOTE — ED Triage Notes (Signed)
Pt states that she fell inside of a hole when she left work today. Pt complains of left ankle pain.

## 2021-10-29 IMAGING — CR DG ANKLE COMPLETE 3+V*L*
3 series · 3 of 3 positions shown · non-contrast
Comparison: None.

CLINICAL DATA: Rolled the ankle

EXAM:
LEFT ANKLE COMPLETE - 3+ VIEW

[x ankle ap left]
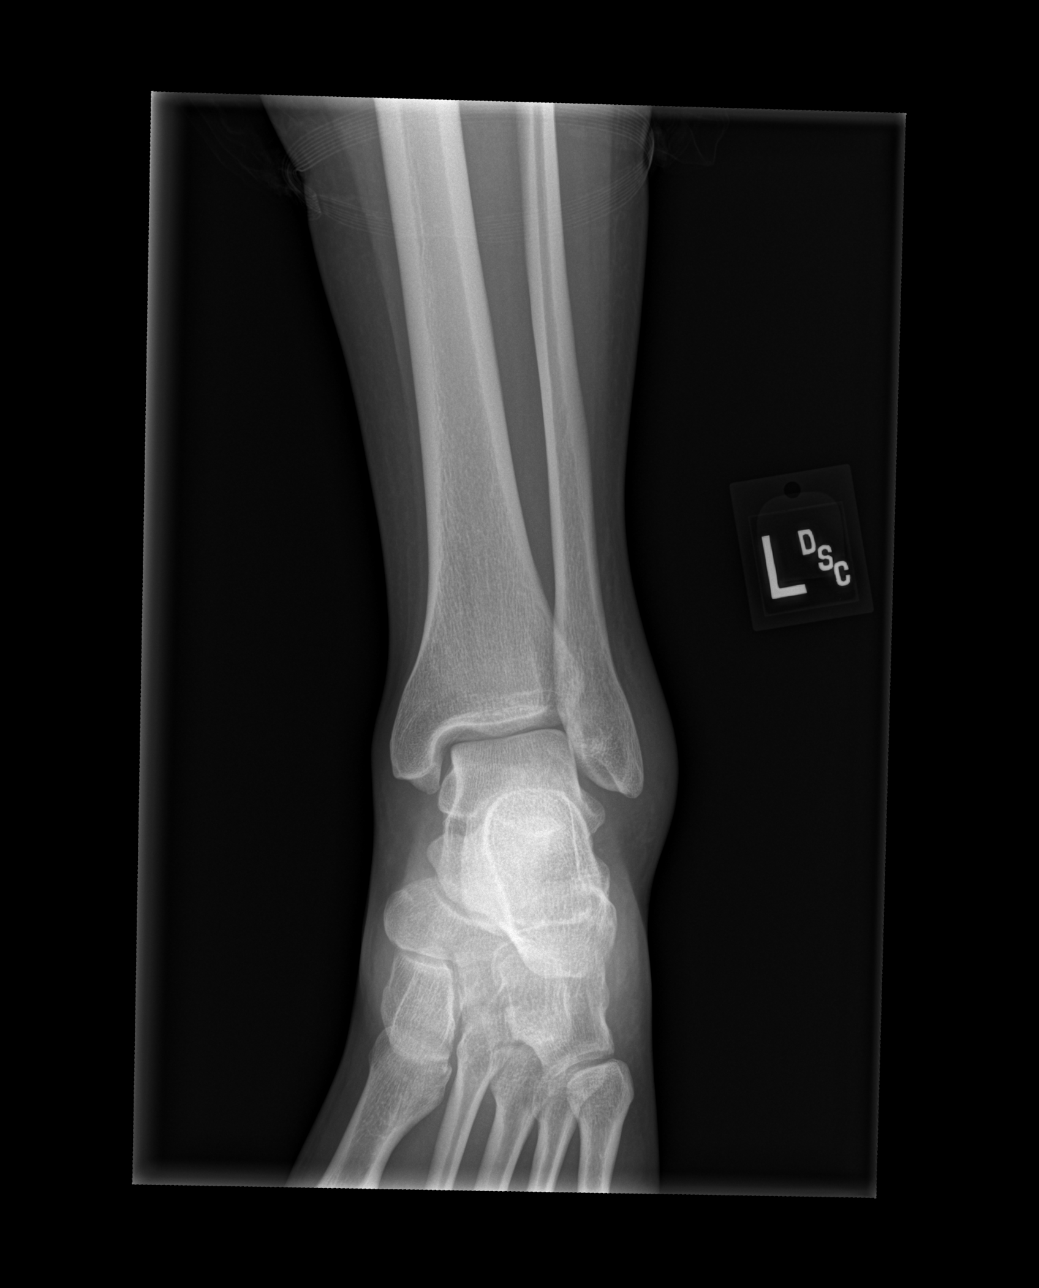

[x ankle obl left]
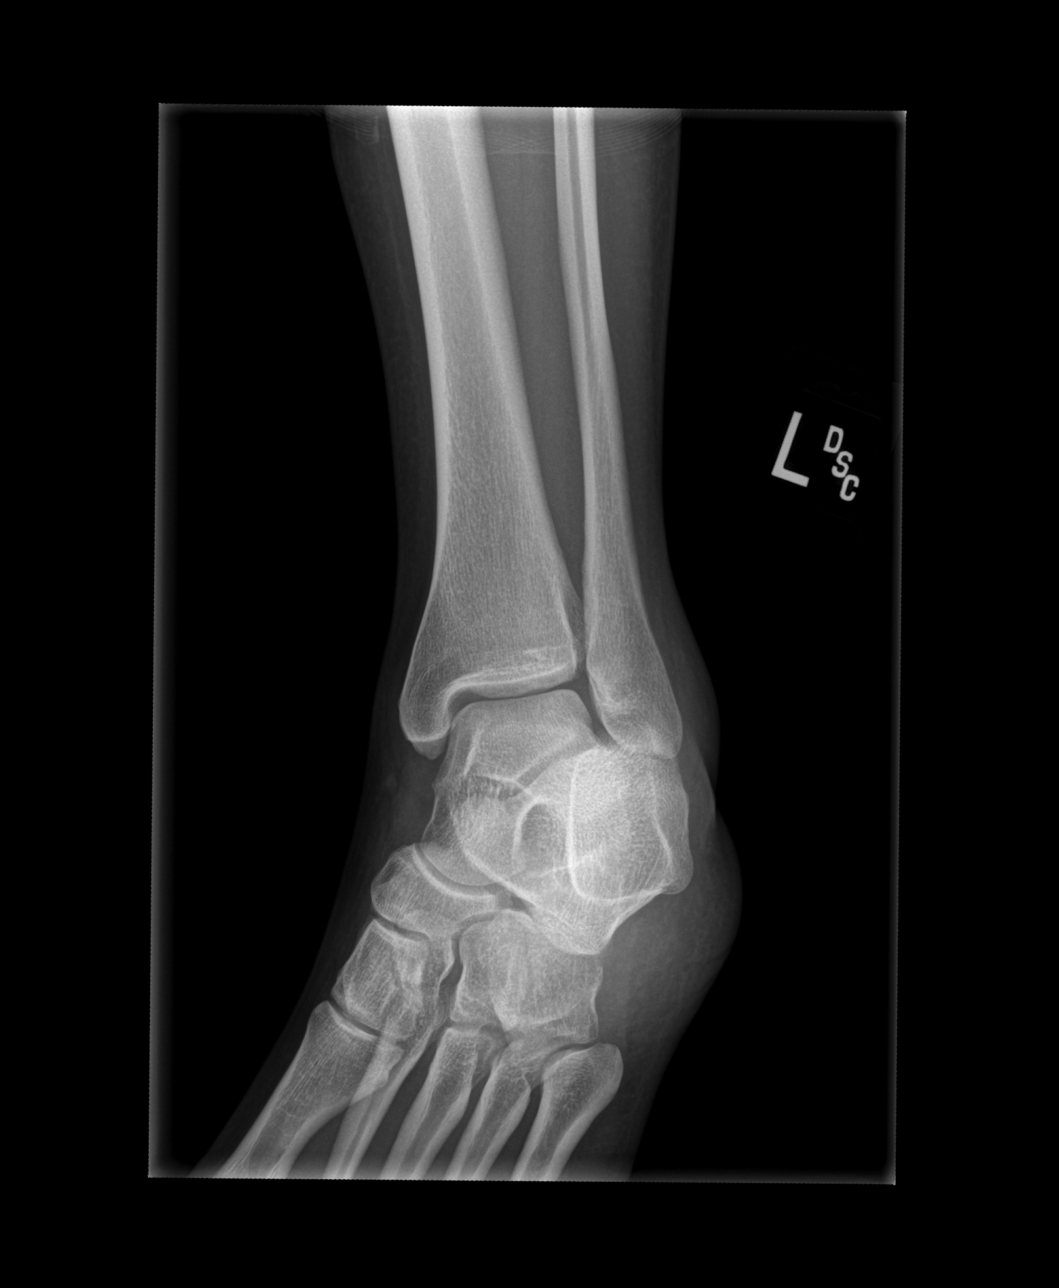

[x ankle lat left]
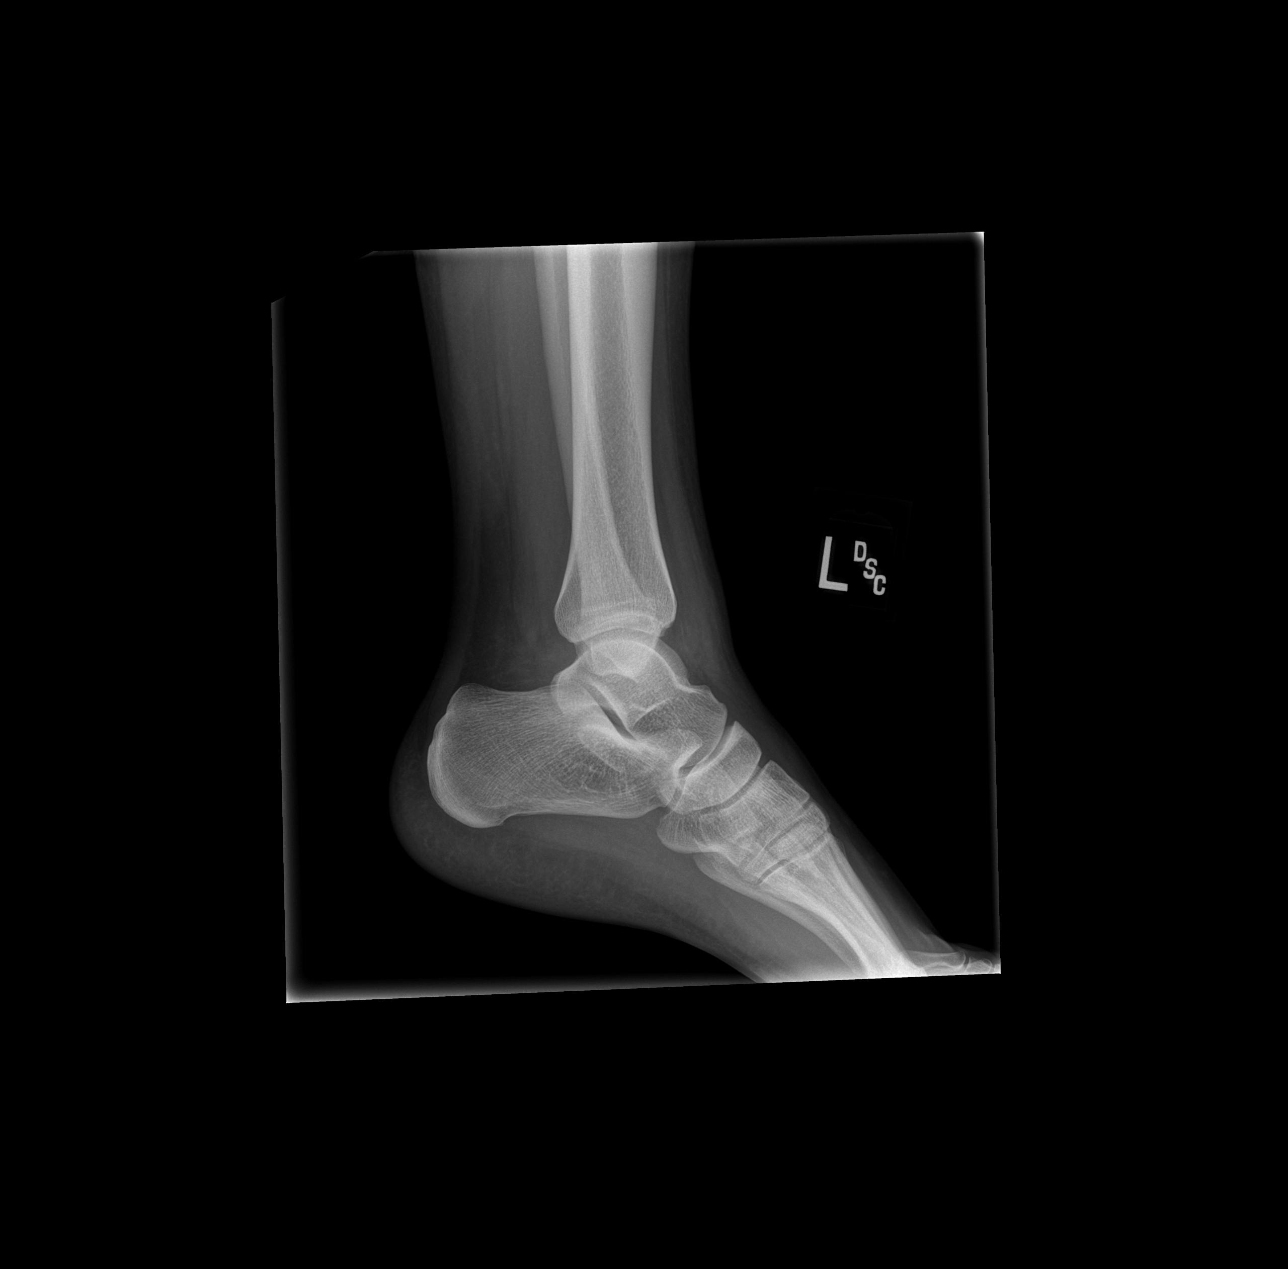

[3 of 3 positions shown; findings below may reference images not displayed]

FINDINGS: There is no evidence of fracture, dislocation, or joint effusion.
There is no evidence of arthropathy or other focal bone abnormality.
Lateral soft tissue swelling.
IMPRESSION: Lateral soft tissue swelling.  No acute osseous abnormality.
